# Patient Record
Sex: Male | Born: 1990 | Race: White | Hispanic: No | Marital: Single | Smoking: Current every day smoker
Health system: Southern US, Community
[De-identification: ages and names within clinical notes are randomized; demographics above are authoritative.]

## PROBLEM LIST (undated history)

## (undated) DIAGNOSIS — L309 Dermatitis, unspecified: Secondary | ICD-10-CM

## (undated) DIAGNOSIS — F909 Attention-deficit hyperactivity disorder, unspecified type: Secondary | ICD-10-CM

## (undated) DIAGNOSIS — B86 Scabies: Secondary | ICD-10-CM

## (undated) DIAGNOSIS — F329 Major depressive disorder, single episode, unspecified: Secondary | ICD-10-CM

## (undated) DIAGNOSIS — F32A Depression, unspecified: Secondary | ICD-10-CM

## (undated) DIAGNOSIS — F319 Bipolar disorder, unspecified: Secondary | ICD-10-CM

## (undated) HISTORY — PX: EYE SURGERY: SHX253

---
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
## 2011-02-03 ENCOUNTER — Inpatient Hospital Stay (INDEPENDENT_AMBULATORY_CARE_PROVIDER_SITE_OTHER)
Admission: RE | Admit: 2011-02-03 | Discharge: 2011-02-03 | Disposition: A | Discharge status: Home / Self Care | Payer: Medicaid Other | Source: Ambulatory Visit | Attending: Emergency Medicine | Admitting: Emergency Medicine

## 2011-02-03 ENCOUNTER — Ambulatory Visit (INDEPENDENT_AMBULATORY_CARE_PROVIDER_SITE_OTHER): Payer: Medicaid Other

## 2011-02-03 DIAGNOSIS — S022XXA Fracture of nasal bones, initial encounter for closed fracture: Secondary | ICD-10-CM

## 2011-02-03 DIAGNOSIS — S01502A Unspecified open wound of oral cavity, initial encounter: Secondary | ICD-10-CM

## 2011-03-09 ENCOUNTER — Emergency Department (HOSPITAL_COMMUNITY)
Admission: EM | Admit: 2011-03-09 | Discharge: 2011-03-09 | Disposition: A | Discharge status: Home / Self Care | Payer: Medicaid Other | Attending: Emergency Medicine | Admitting: Emergency Medicine

## 2011-03-09 DIAGNOSIS — F329 Major depressive disorder, single episode, unspecified: Secondary | ICD-10-CM | POA: Insufficient documentation

## 2011-03-09 DIAGNOSIS — F3289 Other specified depressive episodes: Secondary | ICD-10-CM | POA: Insufficient documentation

## 2011-03-09 LAB — DIFFERENTIAL
Basophils Relative: 1 % (ref 0–1)
Lymphocytes Relative: 24 % (ref 12–46)
Monocytes Absolute: 0.8 10*3/uL (ref 0.1–1.0)
Monocytes Relative: 9 % (ref 3–12)

## 2011-03-09 LAB — CBC
HCT: 44.5 % (ref 39.0–52.0)
MCH: 30.9 pg (ref 26.0–34.0)
MCHC: 34.4 g/dL (ref 30.0–36.0)
MCV: 89.9 fL (ref 78.0–100.0)
RDW: 12 % (ref 11.5–15.5)

## 2011-03-09 LAB — BASIC METABOLIC PANEL
BUN: 13 mg/dL (ref 6–23)
CO2: 25 mEq/L (ref 19–32)
Chloride: 101 mEq/L (ref 96–112)
Glucose, Bld: 95 mg/dL (ref 70–99)
Potassium: 3.7 mEq/L (ref 3.5–5.1)
Sodium: 135 mEq/L (ref 135–145)

## 2011-03-09 LAB — RAPID URINE DRUG SCREEN, HOSP PERFORMED: Cocaine: NOT DETECTED
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
## 2011-04-19 NOTE — Op Note (Signed)
Patrick James, Patrick James NO.:  [id number]   MEDICAL RECORD NO.:  [id number]          PATIENT TYPE:  AMB   LOCATION:  DSC                          FACILITY:  [facility]   PHYSICIAN:  Pasty Spillers. Maple Hudson, M.D. DATE OF BIRTH:  December 14, 1990   DATE OF PROCEDURE:  08/15/2008  DATE OF DISCHARGE:  08/15/2008                               OPERATIVE REPORT   PREOPERATIVE DIAGNOSIS:  V pattern exotropia.   POSTOPERATIVE DIAGNOSIS:  V pattern exotropia.   PROCEDURES:  1. Right lateral rectus muscle recession, 6.5 mm.  2. Right medial rectus muscle resection, 6.5 mm.  3. Inferior oblique muscle recession, both eyes.   SURGEON:  Pasty Spillers. Young, MD   ANESTHESIA:  General (laryngeal mask).   COMPLICATIONS:  None.   DESCRIPTION OF PROCEDURE:  After routine preoperative evaluation  including informed consent from the mother, the patient was taken to the  operating room where he was identified by me.  General anesthesia was  induced without difficulty after placement of appropriate monitors.  The  patient was prepped and draped in standard sterile fashion.  A lid  speculum was placed in the left eye.   Through an inferotemporal fornix incision through conjunctiva and Tenon  fascia, the left lateral rectus muscle was engaged on a series of muscle  hooks, and a traction suture of 4-0 silk was passed under the lateral  rectus muscle insertion.  This was used to draw the eye up and in.  Using 2 muscle hooks through the conjunctival incision for exposure, the  left inferior oblique muscle was identified and engaged on an oblique  hook.  It was cleared of its fascial attachments all the way to its  insertion, which was secured with a fine curved hemostat.  The muscle  was disinserted.  The cut end was secured with a double-arm 6-0 Vicryl  suture,with a double-locking bite at each border of the muscle, 1 mm  from the insertion.  The left inferior rectus muscle was engaged on a  series of  muscle hooks.  A mark was made on the sclera 3 mm posterior  and 3 mm temporal to the temporal border of the inferior rectus  insertion, and this was used as the exit point for the pole sutures of  the inferior oblique, which were passed in crossed swords fashion and  tied securely.  The traction suture was removed.  Conjunctiva was closed  with two 6-0 Vicryl sutures.  The speculum was transferred to the right  eye.   Through an inferonasal fornix incision through conjunctiva and Tenon  fascia, the right medial rectus muscle was engaged on a series of muscle  hooks and cleared of its fascial attachments to at least 15 mm posterior  to the insertion.  Note, that the conjunctival incision was made deep in  the conjunctival fornix, immediately adjacent to the plica semilunaris.  The muscle was spread between 2 self-retaining hooks.  A 2-mm bite was  taken of the center of the muscle belly at a measured distance of 6.5 mm  posterior to the insertion with a  double-arm 6-0 Vicryl suture and a  knot was tied securely at this location.  The needle at each end of the  double-arm suture was passed from the center of the muscle all the way  to periphery, parallel to and 6.5 mm posterior to the insertion.  A  double-locking bite was placed at each border of the muscle.  A  resection clamp was placed on muscle just anterior to these sutures.  The muscle was disinserted.  Each pole suture was passed posteriorly to  anteriorly through the corresponding end of the muscle stump, then  anteriorly to posteriorly near the center of the stump, then posteriorly  to anteriorly through the center of the muscle belly, just posterior to  the previously placed knot.  The muscle was drawn up to the level of the  original insertion, and the sutures were tied securely after making sure  that all slack had been removed.  The clamp was removed.  The portion of  the muscle anterior to the suture was carefully excised.   Conjunctiva  was closed with two 6-0 Vicryl sutures.  Through an inferotemporal  fornix incision through conjunctiva and Tenon fascia, the right inferior  oblique muscle was recessed just as described for the left inferior  oblique muscle.  The right lateral rectus muscle was again engaged on a  series of muscle hooks and cleared of its fascial attachments.  During  the dissection of the right lateral rectus muscle, it appeared that  inferior one fourth of muscle may have been inadvertently vertically  disinserted during the dissection in the inferior oblique.  Remaining  lateral rectus muscle insertion was secured with a double-arm 6-0 Vicryl  suture, double-locking bite at each border of muscle, 1 mm from the  insertion.  The muscle was disinserted and was reattached to the sclera  at a measured distance of 6.5 mm posterior to the original insertion,  using direct scleral passes in crossed swords fashion.  The suture ends  were tied securely after the position of the muscle had been checked and  found to be accurate.  The conjunctiva was closed with two 6-0 Vicryl  sutures after the traction sutures had been removed.  TobraDex ointment  was placed in each eye.  The patient was awakened without difficulty and  taken to the recovery room in stable condition.       Pasty Spillers. Maple Hudson, M.D.  Electronically Signed     WOY/MEDQ  D:  09/25/2008  T:  09/26/2008  Job:  [id number]
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
## 2011-04-22 NOTE — Op Note (Signed)
NAMEJOBIN, Patrick James NO.:  [id number]   MEDICAL RECORD NO.:  [id number]          PATIENT TYPE:  AMB   LOCATION:  DSC                          FACILITY:  [facility]   PHYSICIAN:  Leonia Corona, M.D.  DATE OF BIRTH:  16-Jul-1991   DATE OF PROCEDURE:  08/10/2005  DATE OF DISCHARGE:  08/09/2005                                 OPERATIVE REPORT   PREOPERATIVE DIAGNOSIS:  Infected ingrown right big toenail.   POSTOPERATIVE DIAGNOSIS:  Infected ingrown right big toenail.   OPERATION PERFORMED:  Partial excision of right big toenail.   SURGEON:  Leonia Corona, M.D.   ASSISTANT:  Nurse.   ANESTHESIA:  General laryngeal mask.   INDICATIONS FOR PROCEDURE:  This 20 year old male child was evaluated for a  severely abnormally ingrown toenail with recurrent infection on the right  side.  Hence the indication for the procedure.   DESCRIPTION OF PROCEDURE:  The patient was brought to the operating room and  placed supine on the operating table. General laryngeal mask anesthesia was  given.  The right foot was cleaned, prepped and draped in the usual manner  and a rectangular incision on the outer and inner edges of the right big  toenail was marked which enclosed the ingrown nail as well as the overgrown  soft tissue, extending proximally up to the root of the nail.  The entire  abnormal overgrown soft tissue was excised along with the rectangular piece  of edge of the nail which was lifted off from the nail bed as well as the  matrix was scraped and removed.  Prior to the procedure approximately 10 mL  of 0.25% Marcaine without epinephrine was infiltrated for toe block for  postoperative pain control.  The similar excision of a rectangular piece  from the inner edge was also done, since both the edges were abnormally  digging into the soft tissue, causing recurrent infection. Approximately 50%  of the nail was thus removed, 25% on each side of the nail.  The center  portion of the nail was preserved with the nail bed without any disturbance.  After curetting the matrix of the nail to prevent further growth, the wound  was thoroughly irrigated with dilute hydrogen peroxide and packed with  bacitracin ointment gauze.  A tight pressure dressing was applied to prevent  any active bleeding  which was covered with sterile gauze and Kerlix dressing and wrapped with  Ace bandage.  The patient tolerated the procedure well, which was smooth and  uneventful.  The patient was later extubated and transported to recovery  room in good and stable condition.      Leonia Corona, M.D.  Electronically Signed     SF/MEDQ  D:  08/10/2005  T:  08/11/2005  Job:  [id number]   cc:   Fonnie Mu, M.D.  Fax: [phone]
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
## 2011-04-22 NOTE — Op Note (Signed)
NAMEMARSHUN, DUVA NO.:  [id number]   MEDICAL RECORD NO.:  [id number]          PATIENT TYPE:  AMB   LOCATION:  DSC                          FACILITY:  [facility]   PHYSICIAN:  Leonia Corona, M.D.  DATE OF BIRTH:  09-28-1991   DATE OF PROCEDURE:  06/08/2006  DATE OF DISCHARGE:  06/08/2006                                 OPERATIVE REPORT   SURGEON:  Leonia Corona, M.D.   ASSISTANT:  Nurse.   PREOPERATIVE DIAGNOSIS:  Ingrown infected left big toenail.   POSTOPERATIVE DIAGNOSIS:  Ingrown infected left big toenail.   PROCEDURE PERFORMED:  Partial toenail excision of the left big toe.   ANESTHESIA:  General laryngeal mask anesthesia.   INDICATIONS FOR PROCEDURE:  This 20 year old male child was evaluated for a  painful left big toenail.  Clinical examination revealed ingrown toenail  with infection, which was initially treated with antibiotics, and  subsequently brought in for partial toenail excision.  Hence, the  indication.   PROCEDURE IN DETAIL:  The patient was brought into the operating room and  placed supine on the operating table.  General laryngeal mask anesthesia was  given.  The left foot was cleaned, prepped and draped in the usual manner.  A rectangular-shaped incision was marked on the left big toenail on the  inner edge, and closing part of the nail, lateral skin fold and proximal  skin fold, and soft tissue anteriorly.  The incision was made with a knife,  and then the rectangular piece of nail was lifted off the nail bed with the  help of a Freer.  Then, the nail along with the soft tissue and proximal  nail matrix was excised completely.  The nail bed was scraped to prevent  redevelopment of the nail, including the matrix, which was destroyed by  mechanical cleansing with curet.  The wound was thoroughly irrigated and no  active bleeders were noted.  After cleaning the rectangular piece of excised  tissue, the wound was inspected.  No  active bleeding was noted.  At this  point, we turned our attention to the outer edge of the nail, which also  appeared to be ingrowing into the soft tissue, causing injury.  A  rectangular piece was planned to be removed from this area also, for which  an incision was marked with a marking pen.  Then, the incision was made with  a knife.  The soft tissue along with the outer edge of the toenail,  including the proximal matrix, was excised completely, and the wound was  curettaged to prevent redevelopment of that part of the nail.  The wound was  thoroughly irrigated and packed with Vaseline gauze and a sterile gauze  dressing.  The patient tolerated the procedure very well, which was smooth  and __________.  The patient was later extubated and transported to the  recovery room in good, stable condition.      Leonia Corona, M.D.     SF/MEDQ  D:  07/04/2006  T:  07/04/2006  Job:  [id number]   cc:   Fonnie Mu,  M.D. 

## 2011-04-26 ENCOUNTER — Inpatient Hospital Stay (INDEPENDENT_AMBULATORY_CARE_PROVIDER_SITE_OTHER)
Admission: RE | Admit: 2011-04-26 | Discharge: 2011-04-26 | Disposition: A | Discharge status: Home / Self Care | Payer: Medicaid Other | Source: Ambulatory Visit | Attending: Emergency Medicine | Admitting: Emergency Medicine

## 2011-04-26 DIAGNOSIS — R0789 Other chest pain: Secondary | ICD-10-CM

## 2011-07-05 ENCOUNTER — Encounter (HOSPITAL_COMMUNITY): Payer: Self-pay | Admitting: Radiology

## 2011-07-05 ENCOUNTER — Inpatient Hospital Stay (INDEPENDENT_AMBULATORY_CARE_PROVIDER_SITE_OTHER)
Admission: RE | Admit: 2011-07-05 | Discharge: 2011-07-05 | Disposition: A | Discharge status: Home / Self Care | Payer: Medicaid Other | Source: Ambulatory Visit | Attending: Family Medicine | Admitting: Family Medicine

## 2011-07-05 ENCOUNTER — Emergency Department (HOSPITAL_COMMUNITY): Payer: Medicaid Other

## 2011-07-05 ENCOUNTER — Emergency Department (HOSPITAL_COMMUNITY)
Admission: EM | Admit: 2011-07-05 | Discharge: 2011-07-05 | Disposition: A | Discharge status: Home / Self Care | Payer: Medicaid Other | Attending: Emergency Medicine | Admitting: Emergency Medicine

## 2011-07-05 DIAGNOSIS — R51 Headache: Secondary | ICD-10-CM | POA: Insufficient documentation

## 2011-07-05 DIAGNOSIS — S0990XA Unspecified injury of head, initial encounter: Secondary | ICD-10-CM

## 2011-07-05 DIAGNOSIS — IMO0002 Reserved for concepts with insufficient information to code with codable children: Secondary | ICD-10-CM | POA: Insufficient documentation

## 2011-07-05 DIAGNOSIS — M542 Cervicalgia: Secondary | ICD-10-CM | POA: Insufficient documentation

## 2011-07-05 DIAGNOSIS — S0003XA Contusion of scalp, initial encounter: Secondary | ICD-10-CM | POA: Insufficient documentation

## 2011-07-05 DIAGNOSIS — Z733 Stress, not elsewhere classified: Secondary | ICD-10-CM

## 2011-07-05 DIAGNOSIS — R6884 Jaw pain: Secondary | ICD-10-CM | POA: Insufficient documentation

## 2011-07-18 ENCOUNTER — Emergency Department (HOSPITAL_COMMUNITY)
Admission: EM | Admit: 2011-07-18 | Discharge: 2011-07-18 | Disposition: A | Discharge status: Home / Self Care | Payer: Medicaid Other | Attending: Emergency Medicine | Admitting: Emergency Medicine

## 2011-07-18 DIAGNOSIS — F319 Bipolar disorder, unspecified: Secondary | ICD-10-CM | POA: Insufficient documentation

## 2011-07-18 DIAGNOSIS — Z79899 Other long term (current) drug therapy: Secondary | ICD-10-CM | POA: Insufficient documentation

## 2011-07-18 LAB — DIFFERENTIAL
Basophils Absolute: 0.1 10*3/uL (ref 0.0–0.1)
Basophils Relative: 1 % (ref 0–1)
Eosinophils Absolute: 0.4 10*3/uL (ref 0.0–0.7)
Eosinophils Relative: 6 % — ABNORMAL HIGH (ref 0–5)
Monocytes Absolute: 0.7 10*3/uL (ref 0.1–1.0)
Monocytes Relative: 11 % (ref 3–12)
Neutro Abs: 3.6 10*3/uL (ref 1.7–7.7)
Neutrophils Relative %: 56 % (ref 43–77)

## 2011-07-18 LAB — COMPREHENSIVE METABOLIC PANEL
Albumin: 4.4 g/dL (ref 3.5–5.2)
Alkaline Phosphatase: 67 U/L (ref 39–117)
CO2: 27 mEq/L (ref 19–32)
GFR calc Af Amer: >60 mL/min (ref >60)
GFR calc non Af Amer: >60 mL/min (ref >60)
Glucose, Bld: 100 mg/dL — ABNORMAL HIGH (ref 70–99)
Potassium: 3.8 mEq/L (ref 3.5–5.1)
Sodium: 139 mEq/L (ref 135–145)

## 2011-07-18 LAB — RAPID URINE DRUG SCREEN, HOSP PERFORMED
Cocaine: NOT DETECTED
Opiates: NOT DETECTED
Tetrahydrocannabinol: POSITIVE — AB

## 2011-07-18 LAB — URINALYSIS, ROUTINE W REFLEX MICROSCOPIC
Glucose, UA: NEGATIVE mg/dL
Hgb urine dipstick: NEGATIVE
Ketones, ur: NEGATIVE mg/dL
Leukocytes, UA: NEGATIVE
Protein, ur: NEGATIVE mg/dL
Urobilinogen, UA: 1 mg/dL (ref 0.0–1.0)
pH: 6 (ref 5.0–8.0)

## 2011-07-18 LAB — CBC
MCV: 88 fL (ref 78.0–100.0)
Platelets: 192 10*3/uL (ref 150–400)
WBC: 6.4 10*3/uL (ref 4.0–10.5)

## 2011-08-11 ENCOUNTER — Inpatient Hospital Stay (INDEPENDENT_AMBULATORY_CARE_PROVIDER_SITE_OTHER)
Admission: RE | Admit: 2011-08-11 | Discharge: 2011-08-11 | Disposition: A | Discharge status: Home / Self Care | Payer: Medicaid Other | Source: Ambulatory Visit | Attending: Family Medicine | Admitting: Family Medicine

## 2011-08-11 DIAGNOSIS — L738 Other specified follicular disorders: Secondary | ICD-10-CM

## 2011-09-09 LAB — COMPREHENSIVE METABOLIC PANEL
ALT: 22
AST: 20
Albumin: 3.9
BUN: 10
Calcium: 9.6
Creatinine, Ser: 0.75
Sodium: 136

## 2011-09-09 LAB — URINALYSIS, DIPSTICK ONLY
Nitrite: NEGATIVE
Specific Gravity, Urine: 1.029
Urobilinogen, UA: 0.2

## 2011-09-09 LAB — LIPID PANEL
HDL: 25 — ABNORMAL LOW
Triglycerides: 102
VLDL: 20

## 2011-09-12 LAB — CBC
MCV: 86.3
Platelets: 198
WBC: 8.6

## 2011-09-12 LAB — BASIC METABOLIC PANEL
BUN: 11
Chloride: 103
Creatinine, Ser: 0.71

## 2011-09-12 LAB — DIFFERENTIAL
Basophils Absolute: 0.1
Basophils Relative: 1
Eosinophils Absolute: 0.7
Lymphs Abs: 3.3
Neutrophils Relative %: 45

## 2011-09-12 LAB — HEPATIC FUNCTION PANEL
Alkaline Phosphatase: 138
Bilirubin, Direct: 0.2
Indirect Bilirubin: 0.4
Total Bilirubin: 0.6
Total Protein: 7.1

## 2011-09-12 LAB — DRUGS OF ABUSE SCREEN W/O ALC, ROUTINE URINE
Creatinine,U: 203.9
Marijuana Metabolite: NEGATIVE
Opiate Screen, Urine: NEGATIVE
Propoxyphene: NEGATIVE

## 2011-09-12 LAB — URINALYSIS, ROUTINE W REFLEX MICROSCOPIC
Bilirubin Urine: NEGATIVE
Nitrite: NEGATIVE
Specific Gravity, Urine: 1.029
Urobilinogen, UA: 1

## 2011-09-12 LAB — GAMMA GT: GGT: 24

## 2011-09-12 LAB — T4, FREE: Free T4: 1.18

## 2011-09-12 LAB — GC/CHLAMYDIA PROBE AMP, URINE
Chlamydia, Swab/Urine, PCR: NEGATIVE
GC Probe Amp, Urine: NEGATIVE

## 2011-09-28 ENCOUNTER — Emergency Department (HOSPITAL_COMMUNITY)
Admission: EM | Admit: 2011-09-28 | Discharge: 2011-09-28 | Disposition: A | Discharge status: Home / Self Care | Payer: Medicaid Other | Attending: Emergency Medicine | Admitting: Emergency Medicine

## 2011-09-28 DIAGNOSIS — F3130 Bipolar disorder, current episode depressed, mild or moderate severity, unspecified: Secondary | ICD-10-CM | POA: Insufficient documentation

## 2011-09-28 DIAGNOSIS — L259 Unspecified contact dermatitis, unspecified cause: Secondary | ICD-10-CM | POA: Insufficient documentation

## 2011-09-28 DIAGNOSIS — R21 Rash and other nonspecific skin eruption: Secondary | ICD-10-CM | POA: Insufficient documentation

## 2011-09-28 DIAGNOSIS — F988 Other specified behavioral and emotional disorders with onset usually occurring in childhood and adolescence: Secondary | ICD-10-CM | POA: Insufficient documentation

## 2011-10-18 ENCOUNTER — Encounter (HOSPITAL_COMMUNITY): Payer: Self-pay | Admitting: Emergency Medicine

## 2011-10-18 ENCOUNTER — Emergency Department (HOSPITAL_COMMUNITY)
Admission: EM | Admit: 2011-10-18 | Discharge: 2011-10-18 | Disposition: A | Discharge status: Home / Self Care | Payer: Medicaid Other | Attending: Emergency Medicine | Admitting: Emergency Medicine

## 2011-10-18 DIAGNOSIS — L408 Other psoriasis: Secondary | ICD-10-CM | POA: Insufficient documentation

## 2011-10-18 DIAGNOSIS — R21 Rash and other nonspecific skin eruption: Secondary | ICD-10-CM | POA: Insufficient documentation

## 2011-10-18 DIAGNOSIS — L259 Unspecified contact dermatitis, unspecified cause: Secondary | ICD-10-CM | POA: Insufficient documentation

## 2011-10-18 DIAGNOSIS — L309 Dermatitis, unspecified: Secondary | ICD-10-CM

## 2011-10-18 DIAGNOSIS — L409 Psoriasis, unspecified: Secondary | ICD-10-CM

## 2011-10-18 HISTORY — DX: Dermatitis, unspecified: L30.9

## 2011-10-18 MED ORDER — PREDNISONE 10 MG PO TABS: 20.0000 mg | ORAL_TABLET | Freq: Every day | ORAL | Status: AC

## 2011-10-18 MED ORDER — HYDROXYZINE HCL 25 MG PO TABS: 25.0000 mg | ORAL_TABLET | Freq: Four times a day (QID) | ORAL | Status: AC

## 2011-10-18 MED ORDER — PREDNISONE 20 MG PO TABS
40.0000 mg | ORAL_TABLET | Freq: Once | ORAL | Status: AC
  Administered 2011-10-18: 40 mg via ORAL

## 2011-10-18 MED ORDER — PREDNISONE 20 MG PO TABS
ORAL_TABLET | ORAL | Status: AC
  Filled 2011-10-18: qty 2

## 2011-10-18 NOTE — ED Notes (Signed)
PT. REPORTS GENERALIZED ITCHY RASHES X3 MONTHS . RESPIRATIONS UNLABORED.

## 2011-10-18 NOTE — ED Provider Notes (Addendum)
History     CSN: [id number] Arrival date & time: 10/18/2011  5:58 AM   First MD Initiated Contact with Patient 10/18/11 [phone]    Chief Complaint  Patient presents with  . Rash    (Consider location/radiation/quality/duration/timing/severity/associated sxs/prior treatment) HPI Patient presents to emergency room with chief complaint of rash for the last 3 months.  Patient has past history of eczema.  The rash is primarily on his stomach, trunk, upper extremities, bilateral hands.  Rash is not associated with fever, nausea, vomiting.  Patient has no exposure to chemicals, pet dander, or new medications.  Patient does take medication for ADHD    Past Medical History  Diagnosis Date  . Eczema     History reviewed. No pertinent past surgical history.  History reviewed. No pertinent family history.  History  Substance Use Topics  . Smoking status: Never Smoker   . Smokeless tobacco: Not on file  . Alcohol Use: No      Review of Systems  All other systems reviewed and are negative.    Allergies  Review of patient's allergies indicates no known allergies.  Home Medications   Current Outpatient Rx  Name Route Sig Dispense Refill  . LISDEXAMFETAMINE DIMESYLATE 40 MG PO CAPS Oral Take 40 mg by mouth every morning.        BP 146/65  Pulse 79  Temp(Src) 97.7 F (36.5 C) (Oral)  Resp 14  SpO2 100%  Physical Exam  Nursing note and vitals reviewed. Constitutional: He is oriented to person, place, and time. He appears well-developed and well-nourished. No distress.  HENT:  Head: Normocephalic and atraumatic.  Eyes: Pupils are equal, round, and reactive to light.  Neck: Normal range of motion.  Cardiovascular: Normal rate and intact distal pulses.   Pulmonary/Chest: No respiratory distress.  Abdominal: Normal appearance. He exhibits no distension.  Musculoskeletal: Normal range of motion.  Neurological: He is alert and oriented to person, place, and time. No cranial  nerve deficit.  Skin: Skin is warm and dry. Rash noted. No purpura noted. Rash is not vesicular.     Psychiatric: He has a normal mood and affect. His behavior is normal.    ED Course  Procedures (including critical care time)  Labs Reviewed - No data to display No results found.   1. Eczema   2. Psoriasis       MDM   Plan is for a round of steroids in medication for itching.       Nelia Shi, MD 10/18/11 1191  Nelia Shi, MD 10/18/11 [phone]

## 2011-10-22 ENCOUNTER — Encounter (HOSPITAL_COMMUNITY): Payer: Self-pay | Admitting: Emergency Medicine

## 2011-10-22 ENCOUNTER — Emergency Department (HOSPITAL_COMMUNITY)
Admission: EM | Admit: 2011-10-22 | Discharge: 2011-10-22 | Disposition: A | Discharge status: Home / Self Care | Payer: Medicaid Other | Attending: Emergency Medicine | Admitting: Emergency Medicine

## 2011-10-22 DIAGNOSIS — L2989 Other pruritus: Secondary | ICD-10-CM | POA: Insufficient documentation

## 2011-10-22 DIAGNOSIS — L298 Other pruritus: Secondary | ICD-10-CM | POA: Insufficient documentation

## 2011-10-22 DIAGNOSIS — L309 Dermatitis, unspecified: Secondary | ICD-10-CM

## 2011-10-22 DIAGNOSIS — L259 Unspecified contact dermatitis, unspecified cause: Secondary | ICD-10-CM | POA: Insufficient documentation

## 2011-10-22 MED ORDER — ATOPICLAIR EX CREA: TOPICAL_CREAM | CUTANEOUS | Status: DC

## 2011-10-22 NOTE — ED Notes (Signed)
Pt presents to department for evaluation of generalized rash all over body. Pt states severe itching and irritation. History of eczema. States he doesn't have any cream to use at home. Pt resting quietly at the time. No signs of distress. He is alert and oriented x4.

## 2011-10-22 NOTE — ED Notes (Signed)
C/o eczema x 4 months that is getting worse.

## 2011-10-22 NOTE — ED Provider Notes (Signed)
Evaluation and management procedures were performed by the mid-level provider (PA/NP/CNM) under my supervision/collaboration. I was present and available during the ED course. Patrick Karg Y.   Gavin Pound. Oletta Lamas, MD 10/22/11 [phone]

## 2011-10-22 NOTE — ED Notes (Signed)
Pt given sandwich and drink. Had no further questions regarding discharge.

## 2011-10-22 NOTE — ED Provider Notes (Signed)
History     CSN: [id number] Arrival date & time: 10/22/2011 12:44 AM   First MD Initiated Contact with Patient 10/22/11 0138      Chief Complaint  Patient presents with  . Eczema     Patient is a 20 y.o. male presenting with rash.  Rash  This is a chronic problem. The current episode started more than 1 week ago. The problem has not changed since onset.The problem is associated with nothing. There has been no fever. The rash is present on the trunk, right arm, left arm, right lower leg, left upper leg, right upper leg and left lower leg. The patient is experiencing no pain. The pain has been constant since onset. Associated symptoms include itching.   patient reports persistent rash to bilateral upper extremities and  Torso. Of eczema since childhood. Was seen here in the emergency department 10/19/2011 for same was placed on a week of prednisone and hydroxyzine for itching. Returns tonight stating meds are not help.   Past Medical History  Diagnosis Date  . Eczema     History reviewed. No pertinent past surgical history.  No family history on file.  History  Substance Use Topics  . Smoking status: Never Smoker   . Smokeless tobacco: Not on file  . Alcohol Use: No      Review of Systems  Constitutional: Negative.   HENT: Negative.   Eyes: Negative.   Respiratory: Negative.   Cardiovascular: Negative.   Gastrointestinal: Negative.   Genitourinary: Negative.   Musculoskeletal: Negative.   Skin: Positive for itching and rash.  Neurological: Negative.   Hematological: Negative.   Psychiatric/Behavioral: Negative.     Allergies  Review of patient's allergies indicates no known allergies.  Home Medications   Current Outpatient Rx  Name Route Sig Dispense Refill  . HYDROXYZINE HCL 25 MG PO TABS Oral Take 1 tablet (25 mg total) by mouth every 6 (six) hours. 12 tablet 0  . LISDEXAMFETAMINE DIMESYLATE 40 MG PO CAPS Oral Take 40 mg by mouth every morning.      [name]  PREDNISONE 10 MG PO TABS Oral Take 2 tablets (20 mg total) by mouth daily. 15 tablet 0    BP 136/77  Pulse 77  Temp(Src) 96.8 F (36 C) (Oral)  SpO2 98%  Physical Exam  Constitutional: He is oriented to person, place, and time. He appears well-developed and well-nourished.  HENT:  Head: Normocephalic and atraumatic.  Eyes: Pupils are equal, round, and reactive to light.  Neck: Neck supple.  Cardiovascular: Normal rate and regular rhythm.   Pulmonary/Chest: Effort normal and breath sounds normal.  Abdominal: Soft. Bowel sounds are normal.  Neurological: He is alert and oriented to person, place, and time.  Skin: Skin is warm and dry.       Fine, erythematous, crusty rash noted to bilateral upper extremities bilateral lower, extremities,  Torso and back.    Psychiatric: He has a normal mood and affect.    ED Course  Procedures patient reports persistent eczema flare x3-4 months. Patient states rash is consistent with rash that he has intermittently since childhood. Seen 10/19/2011 here in the ED and started on prednisone and hydroxyzine but states they are not helping.   Labs Reviewed - No data to display No results found.   No diagnosis found.    MDM  Will add atopic layer cream to current medication regimen for rash and provide dermatology referral if rash is not improving.  Leanne Chang, NP 10/22/11 0225

## 2011-11-04 ENCOUNTER — Encounter (HOSPITAL_COMMUNITY): Payer: Self-pay | Admitting: *Deleted

## 2011-11-04 ENCOUNTER — Emergency Department (HOSPITAL_COMMUNITY)
Admission: EM | Admit: 2011-11-04 | Discharge: 2011-11-04 | Disposition: A | Discharge status: Home / Self Care | Payer: Medicaid Other | Source: Home / Self Care | Attending: Emergency Medicine | Admitting: Emergency Medicine

## 2011-11-04 ENCOUNTER — Emergency Department (HOSPITAL_COMMUNITY)
Admission: EM | Admit: 2011-11-04 | Discharge: 2011-11-05 | Disposition: A | Discharge status: Home / Self Care | Payer: Medicaid Other | Attending: Emergency Medicine | Admitting: Emergency Medicine

## 2011-11-04 DIAGNOSIS — S4980XA Other specified injuries of shoulder and upper arm, unspecified arm, initial encounter: Secondary | ICD-10-CM | POA: Insufficient documentation

## 2011-11-04 DIAGNOSIS — Y9239 Other specified sports and athletic area as the place of occurrence of the external cause: Secondary | ICD-10-CM | POA: Insufficient documentation

## 2011-11-04 DIAGNOSIS — F909 Attention-deficit hyperactivity disorder, unspecified type: Secondary | ICD-10-CM | POA: Insufficient documentation

## 2011-11-04 DIAGNOSIS — IMO0002 Reserved for concepts with insufficient information to code with codable children: Secondary | ICD-10-CM

## 2011-11-04 DIAGNOSIS — B86 Scabies: Secondary | ICD-10-CM | POA: Insufficient documentation

## 2011-11-04 DIAGNOSIS — W219XXA Striking against or struck by unspecified sports equipment, initial encounter: Secondary | ICD-10-CM | POA: Insufficient documentation

## 2011-11-04 DIAGNOSIS — Z79899 Other long term (current) drug therapy: Secondary | ICD-10-CM | POA: Insufficient documentation

## 2011-11-04 DIAGNOSIS — F3289 Other specified depressive episodes: Secondary | ICD-10-CM | POA: Insufficient documentation

## 2011-11-04 DIAGNOSIS — F329 Major depressive disorder, single episode, unspecified: Secondary | ICD-10-CM | POA: Insufficient documentation

## 2011-11-04 DIAGNOSIS — M25519 Pain in unspecified shoulder: Secondary | ICD-10-CM | POA: Insufficient documentation

## 2011-11-04 DIAGNOSIS — S40019A Contusion of unspecified shoulder, initial encounter: Secondary | ICD-10-CM | POA: Insufficient documentation

## 2011-11-04 DIAGNOSIS — S46909A Unspecified injury of unspecified muscle, fascia and tendon at shoulder and upper arm level, unspecified arm, initial encounter: Secondary | ICD-10-CM | POA: Insufficient documentation

## 2011-11-04 DIAGNOSIS — L989 Disorder of the skin and subcutaneous tissue, unspecified: Secondary | ICD-10-CM | POA: Insufficient documentation

## 2011-11-04 DIAGNOSIS — Y9367 Activity, basketball: Secondary | ICD-10-CM | POA: Insufficient documentation

## 2011-11-04 HISTORY — DX: Attention-deficit hyperactivity disorder, unspecified type: F90.9

## 2011-11-04 HISTORY — DX: Major depressive disorder, single episode, unspecified: F32.9

## 2011-11-04 HISTORY — DX: Depression, unspecified: F32.A

## 2011-11-04 MED ORDER — PERMETHRIN 5 % EX CREA
TOPICAL_CREAM | Freq: Once | CUTANEOUS | Status: AC
  Administered 2011-11-04: 04:00:00 via TOPICAL
  Filled 2011-11-04: qty 60

## 2011-11-04 MED ORDER — ACETAMINOPHEN 325 MG PO TABS
650.0000 mg | ORAL_TABLET | Freq: Once | ORAL | Status: AC
  Administered 2011-11-04: 650 mg via ORAL
  Filled 2011-11-04: qty 2

## 2011-11-04 MED ORDER — DIPHENHYDRAMINE HCL 25 MG PO CAPS
25.0000 mg | ORAL_CAPSULE | Freq: Once | ORAL | Status: AC
  Administered 2011-11-04: 25 mg via ORAL
  Filled 2011-11-04: qty 1

## 2011-11-04 MED ORDER — PERMETHRIN 5 % EX CREA: TOPICAL_CREAM | CUTANEOUS | Status: DC

## 2011-11-04 NOTE — ED Notes (Signed)
Pt presents with a generalized rash that covers his arms, legs, trunk, and neck.  Pt states that said rash itches quite badly.  Pt states that the rash started approximately 1 month ago and has progressively worsened.

## 2011-11-04 NOTE — ED Notes (Signed)
Per EMS:  Pt began having an itchy rash about 2 months ago for which he has received prednisone with no relief and his symptoms of itching have worsened.

## 2011-11-04 NOTE — ED Provider Notes (Signed)
Medical screening examination/treatment/procedure(s) were performed by non-physician practitioner and as supervising physician I was immediately available for consultation/collaboration.  Olivia Mackie, MD 11/04/11 [phone]

## 2011-11-04 NOTE — ED Provider Notes (Signed)
History     CSN: [id number] Arrival date & time: 11/04/2011  1:54 AM   First MD Initiated Contact with Patient 11/04/11 0301      Chief Complaint  Patient presents with  . Urticaria    (Consider location/radiation/quality/duration/timing/severity/associated sxs/prior treatment) Patient is a 20 y.o. male presenting with urticaria. The history is provided by the patient.  Urticaria This is a chronic problem. The current episode started more than 1 month ago. The problem occurs constantly. The problem has been gradually worsening. Associated symptoms include a rash. Pertinent negatives include no fever.    Past Medical History  Diagnosis Date  . Eczema   . ADHD (attention deficit hyperactivity disorder)   . Depression     History reviewed. No pertinent past surgical history.  History reviewed. No pertinent family history.  History  Substance Use Topics  . Smoking status: Never Smoker   . Smokeless tobacco: Not on file  . Alcohol Use: No      Review of Systems  Constitutional: Negative.  Negative for fever.  HENT: Negative.   Eyes: Negative.   Respiratory: Negative.   Cardiovascular: Negative.   Gastrointestinal: Negative.   Genitourinary: Negative.   Musculoskeletal: Negative.   Skin: Positive for rash.  Neurological: Negative.   Hematological: Negative.   Psychiatric/Behavioral: Negative.     Allergies  Review of patient's allergies indicates no known allergies.  Home Medications   Current Outpatient Rx  Name Route Sig Dispense Refill  . ATOPICLAIR EX CREA  Apply to affected areas TID 1 Tube 0  . LISDEXAMFETAMINE DIMESYLATE 40 MG PO CAPS Oral Take 40 mg by mouth every morning.      [name] PERMETHRIN 5 % EX CREA  Apply to affected area once 60 g 0    BP 156/69  Pulse 80  Temp(Src) 98 F (36.7 C) (Oral)  Resp 19  SpO2 100%  Physical Exam  Constitutional: He is oriented to person, place, and time. He appears well-developed and well-nourished.  HENT:    Head: Normocephalic.  Eyes: Pupils are equal, round, and reactive to light.  Neck: Normal range of motion. Neck supple.  Cardiovascular: Normal rate.   Pulmonary/Chest: Effort normal.  Abdominal: Soft.  Musculoskeletal: Normal range of motion.  Neurological: He is oriented to person, place, and time.  Skin: Skin is warm. Lesion and rash noted. Rash is not urticarial. There is erythema.    ED Course  Procedures (including critical care time)  Labs Reviewed - No data to display No results found.   1. Itch, scabies       MDM  Diffuse pruritic rash started between fingers and toes  Patient is homeless.  Has been treated with steroids for potenial eczema without relief         Arman Filter, NP 11/04/11 0518  Arman Filter, NP 11/04/11 [phone]

## 2011-11-04 NOTE — ED Notes (Signed)
He was playing footballl approx 6 hours ago and injured his lt  Shoulder.  C/o pain-

## 2011-11-05 ENCOUNTER — Emergency Department (HOSPITAL_COMMUNITY): Payer: Medicaid Other

## 2011-11-05 ENCOUNTER — Emergency Department (HOSPITAL_COMMUNITY)
Admission: EM | Admit: 2011-11-05 | Discharge: 2011-11-06 | Discharge status: Against Medical Advice | Payer: Medicaid Other | Attending: Emergency Medicine | Admitting: Emergency Medicine

## 2011-11-05 DIAGNOSIS — N23 Unspecified renal colic: Secondary | ICD-10-CM | POA: Insufficient documentation

## 2011-11-05 MED ORDER — IBUPROFEN 800 MG PO TABS
800.0000 mg | ORAL_TABLET | Freq: Once | ORAL | Status: AC
  Administered 2011-11-05: 800 mg via ORAL
  Filled 2011-11-05: qty 1

## 2011-11-05 NOTE — ED Provider Notes (Signed)
History     CSN: [id number] Arrival date & time: 11/04/2011 11:50 PM   First MD Initiated Contact with Patient 11/05/11 0245      Chief Complaint  Patient presents with  . Shoulder Injury    (Consider location/radiation/quality/duration/timing/severity/associated sxs/prior treatment) HPI Comments: And reports playing basketball today and was hit in the left shoulder now having pain with range of motion.  No deformity noted, saw this patient.  Last night, treated him for his scabies.  He stays at [employer].  Holding the arm stills seems to help slightly.   Patient is a 20 y.o. male presenting with shoulder injury. The history is provided by the patient.  Shoulder Injury This is a new problem. The current episode started today. The problem occurs constantly. The problem has been unchanged. Pertinent negatives include no joint swelling or myalgias. The symptoms are aggravated by exertion. He has tried nothing for the symptoms. The treatment provided no relief.    Past Medical History  Diagnosis Date  . Eczema   . ADHD (attention deficit hyperactivity disorder)   . Depression     History reviewed. No pertinent past surgical history.  History reviewed. No pertinent family history.  History  Substance Use Topics  . Smoking status: Never Smoker   . Smokeless tobacco: Not on file  . Alcohol Use: No      Review of Systems  Constitutional: Negative.   HENT: Negative.   Eyes: Negative.   Cardiovascular: Negative.   Gastrointestinal: Negative.   Genitourinary: Negative.   Musculoskeletal: Negative for myalgias, back pain and joint swelling.  Neurological: Negative.   Hematological: Negative.   Psychiatric/Behavioral: Negative.     Allergies  Review of patient's allergies indicates no known allergies.  Home Medications   Current Outpatient Rx  Name Route Sig Dispense Refill  . LISDEXAMFETAMINE DIMESYLATE 40 MG PO CAPS Oral Take 40 mg by mouth every morning.     [name]  PAROXETINE HCL 20 MG PO TABS Oral Take 20 mg by mouth every morning.        BP 135/74  Pulse 90  Temp(Src) 97.9 F (36.6 C) (Oral)  Resp 16  SpO2 98%  Physical Exam  Constitutional: He is oriented to person, place, and time. He appears well-developed and well-nourished.  HENT:  Head: Normocephalic.  Eyes: Pupils are equal, round, and reactive to light.  Neck: Neck supple.  Cardiovascular: Normal rate.   Pulmonary/Chest: Effort normal.  Musculoskeletal:       Left shoulder: He exhibits tenderness and pain. He exhibits no swelling, no deformity and normal pulse.  Neurological: He is oriented to person, place, and time.  Skin: Skin is warm and dry.    ED Course  Procedures (including critical care time)  Labs Reviewed - No data to display Dg Shoulder Left  11/05/2011  *RADIOLOGY REPORT*  Clinical Data: Left shoulder pain from football injury.  Limited range of motion and left shoulder swelling.  LEFT SHOULDER - 2+ VIEW  Comparison: None.  Findings: There is no evidence of fracture or dislocation.  The left humeral head is seated within the glenoid fossa.  The acromioclavicular joint is unremarkable in appearance.  No significant soft tissue abnormalities are seen.  The visualized portions of the left lung are clear.  IMPRESSION: No evidence of fracture or dislocation.  Original Report Authenticated By: Tonia Ghent, M.D.     1. Contusion, shoulder /upper arm       MDM  Is most likely a contusion to  2 history and physical exam, x-ray was ordered at triage and reviewed, which indicates no fracture or dislocation.  We'll treat this patient with ibuprofen        Arman Filter, NP 11/05/11 0541  Arman Filter, NP 11/05/11 [phone]

## 2011-11-05 NOTE — ED Provider Notes (Signed)
Medical screening examination/treatment/procedure(s) were performed by non-physician practitioner and as supervising physician I was immediately available for consultation/collaboration.  Doug Sou, MD 11/05/11 [phone]

## 2011-11-05 NOTE — ED Notes (Signed)
Pt resting quietly with eyes closed, no s/s of any pain or distress observed, pt is awaiting disposition and will continue to monitor pt.

## 2011-11-05 NOTE — ED Notes (Signed)
Pt reports hurting left shoulder playing football, pt resting with eyes closed upon assessment. Pt was easily arousable with reports of pain 7/10, MAE x4, INAD at this time.

## 2011-11-06 ENCOUNTER — Encounter (HOSPITAL_COMMUNITY): Payer: Self-pay | Admitting: Nurse Practitioner

## 2011-11-06 ENCOUNTER — Emergency Department (HOSPITAL_COMMUNITY): Payer: Medicaid Other

## 2011-11-06 ENCOUNTER — Emergency Department (HOSPITAL_COMMUNITY)
Admission: EM | Admit: 2011-11-06 | Discharge: 2011-11-06 | Disposition: A | Discharge status: Home / Self Care | Payer: Medicaid Other | Attending: Emergency Medicine | Admitting: Emergency Medicine

## 2011-11-06 DIAGNOSIS — F909 Attention-deficit hyperactivity disorder, unspecified type: Secondary | ICD-10-CM | POA: Insufficient documentation

## 2011-11-06 DIAGNOSIS — L298 Other pruritus: Secondary | ICD-10-CM | POA: Insufficient documentation

## 2011-11-06 DIAGNOSIS — L2989 Other pruritus: Secondary | ICD-10-CM | POA: Insufficient documentation

## 2011-11-06 DIAGNOSIS — B86 Scabies: Secondary | ICD-10-CM

## 2011-11-06 DIAGNOSIS — Y9383 Activity, rough housing and horseplay: Secondary | ICD-10-CM | POA: Insufficient documentation

## 2011-11-06 DIAGNOSIS — F329 Major depressive disorder, single episode, unspecified: Secondary | ICD-10-CM | POA: Insufficient documentation

## 2011-11-06 DIAGNOSIS — Z79899 Other long term (current) drug therapy: Secondary | ICD-10-CM | POA: Insufficient documentation

## 2011-11-06 DIAGNOSIS — R079 Chest pain, unspecified: Secondary | ICD-10-CM | POA: Insufficient documentation

## 2011-11-06 DIAGNOSIS — S299XXA Unspecified injury of thorax, initial encounter: Secondary | ICD-10-CM

## 2011-11-06 DIAGNOSIS — F3289 Other specified depressive episodes: Secondary | ICD-10-CM | POA: Insufficient documentation

## 2011-11-06 DIAGNOSIS — X500XXA Overexertion from strenuous movement or load, initial encounter: Secondary | ICD-10-CM | POA: Insufficient documentation

## 2011-11-06 DIAGNOSIS — IMO0001 Reserved for inherently not codable concepts without codable children: Secondary | ICD-10-CM | POA: Insufficient documentation

## 2011-11-06 DIAGNOSIS — R21 Rash and other nonspecific skin eruption: Secondary | ICD-10-CM | POA: Insufficient documentation

## 2011-11-06 DIAGNOSIS — S298XXA Other specified injuries of thorax, initial encounter: Secondary | ICD-10-CM | POA: Insufficient documentation

## 2011-11-06 MED ORDER — PERMETHRIN 0.25 % LIQD
Status: AC
  Administered 2011-11-06: 14:00:00 via TOPICAL
  Filled 2011-11-06: qty 147.86

## 2011-11-06 MED ORDER — OXYCODONE-ACETAMINOPHEN 5-325 MG PO TABS
1.0000 | ORAL_TABLET | Freq: Once | ORAL | Status: AC
  Administered 2011-11-06: 1 via ORAL
  Filled 2011-11-06: qty 1

## 2011-11-06 MED ORDER — PERMETHRIN 5 % EX CREA: TOPICAL_CREAM | CUTANEOUS | Status: AC

## 2011-11-06 MED ORDER — METHYLPREDNISOLONE SODIUM SUCC 125 MG IJ SOLR
125.0000 mg | Freq: Once | INTRAMUSCULAR | Status: AC
  Administered 2011-11-06: 125 mg via INTRAMUSCULAR
  Filled 2011-11-06: qty 2

## 2011-11-06 MED ORDER — PREDNISONE 20 MG PO TABS: 40.0000 mg | ORAL_TABLET | Freq: Every day | ORAL | Status: AC

## 2011-11-06 NOTE — ED Notes (Signed)
Patient transported to X-ray 

## 2011-11-06 NOTE — ED Provider Notes (Signed)
History     CSN: [id number] Arrival date & time: 11/06/2011 11:24 AM   First MD Initiated Contact with Patient 11/06/11 1159      Chief Complaint  Patient presents with  . Generalized Body Aches    (Consider location/radiation/quality/duration/timing/severity/associated sxs/prior treatment) HPI Comments: States that he has tried multiple rounds of treatment for this rash. It initially started on his hands and has now moved to bilateral Arms, trunk, axilla and back. He is scratching constantly due to the severe itching. States he's been diagnosed with eczema, psoriasis, scabies. In  Patient is a 20 y.o. male presenting with rash. The history is provided by the patient.  Rash  This is a chronic problem. Episode onset: 2 months ago. The problem has been gradually worsening. The problem is associated with an unknown factor. There has been no fever. The rash is present on the torso, back, groin, trunk, right arm, right hand, right fingers, left fingers, left hand and left arm. The pain is at a severity of 2/10. The pain is mild. The pain has been constant since onset. Associated symptoms include itching and pain. He has tried OTC analgesics, antihistamines, anti-itch cream and steriods (Has been using permethrin cream this last week) for the symptoms. The treatment provided no relief.    Past Medical History  Diagnosis Date  . Eczema   . ADHD (attention deficit hyperactivity disorder)   . Depression     History reviewed. No pertinent past surgical history.  History reviewed. No pertinent family history.  History  Substance Use Topics  . Smoking status: Never Smoker   . Smokeless tobacco: Not on file  . Alcohol Use: No      Review of Systems  Constitutional: Negative for fever.  Respiratory: Negative for cough and shortness of breath.   Cardiovascular: Positive for chest pain.       Was wrestling with friends last night and felt a pop in his chest and has been hurting ever  since  Skin: Positive for itching and rash.  All other systems reviewed and are negative.    Allergies  Review of patient's allergies indicates no known allergies.  Home Medications   Current Outpatient Rx  Name Route Sig Dispense Refill  . LISDEXAMFETAMINE DIMESYLATE 40 MG PO CAPS Oral Take 40 mg by mouth every morning.     [name] PAROXETINE HCL 20 MG PO TABS Oral Take 20 mg by mouth every morning.        BP 145/74  Pulse 73  Temp(Src) 97.9 F (36.6 C) (Oral)  Resp 20  SpO2 98%  Physical Exam  Nursing note and vitals reviewed. Constitutional: He is oriented to person, place, and time. He appears well-developed and well-nourished. No distress.  HENT:  Head: Normocephalic and atraumatic.  Mouth/Throat: Oropharynx is clear and moist.  Eyes: Conjunctivae and EOM are normal. Pupils are equal, round, and reactive to light.  Neck: Normal range of motion. Neck supple.  Cardiovascular: Normal rate, regular rhythm and intact distal pulses.   No murmur heard. Pulmonary/Chest: Effort normal and breath sounds normal. No respiratory distress. He has no wheezes. He has no rales. He exhibits tenderness. He exhibits no mass, no deformity and no swelling.    Abdominal: Soft. He exhibits no distension. There is no tenderness. There is no rebound and no guarding.  Musculoskeletal: Normal range of motion. He exhibits no edema and no tenderness.  Neurological: He is alert and oriented to person, place, and time.  Skin: Skin is warm  and dry. Rash noted. Rash is maculopapular. No erythema.       Severe maculopapular rash with excoriation, a stabbing and affecting bilateral hands, arms, forearms, legs, shoulders, back, chest. No purulent drainage or erythema suggestive of bacterial infection.  Psychiatric: He has a normal mood and affect. His behavior is normal.    ED Course  Procedures (including critical care time)  Results for orders placed during the hospital encounter of 07/18/11    DIFFERENTIAL      Component Value Range   Neutrophils Relative 56  43 - 77 (%)   Neutro Abs 3.6  1.7 - 7.7 (K/uL)   Lymphocytes Relative 26  12 - 46 (%)   Lymphs Abs 1.7  0.7 - 4.0 (K/uL)   Monocytes Relative 11  3 - 12 (%)   Monocytes Absolute 0.7  0.1 - 1.0 (K/uL)   Eosinophils Relative 6 (*) 0 - 5 (%)   Eosinophils Absolute 0.4  0.0 - 0.7 (K/uL)   Basophils Relative 1  0 - 1 (%)   Basophils Absolute 0.1  0.0 - 0.1 (K/uL)  CBC      Component Value Range   WBC 6.4  4.0 - 10.5 (K/uL)   RBC 5.09  4.22 - 5.81 (MIL/uL)   Hemoglobin 15.5  13.0 - 17.0 (g/dL)   HCT 04.5  40.9 - 81.1 (%)   MCV 88.0  78.0 - 100.0 (fL)   MCH 30.5  26.0 - 34.0 (pg)   MCHC 34.6  30.0 - 36.0 (g/dL)   RDW 91.4  78.2 - 95.6 (%)   Platelets 192  150 - 400 (K/uL)  COMPREHENSIVE METABOLIC PANEL      Component Value Range   Sodium 139  135 - 145 (mEq/L)   Potassium 3.8  3.5 - 5.1 (mEq/L)   Chloride 102  96 - 112 (mEq/L)   CO2 27  19 - 32 (mEq/L)   Glucose, Bld 100 (*) 70 - 99 (mg/dL)   BUN 14  6 - 23 (mg/dL)   Creatinine, Ser 2.13  0.50 - 1.35 (mg/dL)   Calcium 9.8  8.4 - 08.6 (mg/dL)   Total Protein 7.7  6.0 - 8.3 (g/dL)   Albumin 4.4  3.5 - 5.2 (g/dL)   AST 22  0 - 37 (U/L)   ALT 29  0 - 53 (U/L)   Alkaline Phosphatase 67  39 - 117 (U/L)   Total Bilirubin 0.9  0.3 - 1.2 (mg/dL)   GFR calc non Af Amer >60  >60 (mL/min)   GFR calc Af Amer >60  >60 (mL/min)  ETHANOL      Component Value Range   Alcohol, Ethyl (B) <11  0 - 11 (mg/dL)  URINE RAPID DRUG SCREEN (HOSP PERFORMED)      Component Value Range   Opiates NONE DETECTED  NONE DETECTED    Cocaine NONE DETECTED  NONE DETECTED    Benzodiazepines NONE DETECTED  NONE DETECTED    Amphetamines NONE DETECTED  NONE DETECTED    Tetrahydrocannabinol POSITIVE (*) NONE DETECTED    Barbiturates NONE DETECTED  NONE DETECTED   URINALYSIS, ROUTINE W REFLEX MICROSCOPIC      Component Value Range   Color, Urine YELLOW  YELLOW    APPearance CLEAR  CLEAR     Specific Gravity, Urine 1.023  1.005 - 1.030    pH 6.0  5.0 - 8.0    Glucose, UA NEGATIVE  NEGATIVE (mg/dL)   Hgb urine dipstick NEGATIVE  NEGATIVE  Bilirubin Urine NEGATIVE  NEGATIVE    Ketones, ur NEGATIVE  NEGATIVE (mg/dL)   Protein, ur NEGATIVE  NEGATIVE (mg/dL)   Urobilinogen, UA 1.0  0.0 - 1.0 (mg/dL)   Nitrite NEGATIVE  NEGATIVE    Leukocytes, UA NEGATIVE  NEGATIVE    Dg Chest 2 View  11/06/2011  *RADIOLOGY REPORT*  Clinical Data: Left chest pain  CHEST - 2 VIEW  Comparison:  02/23/2009  Findings:  The heart size and mediastinal contours are within normal limits.  Both lungs are clear.  The visualized skeletal structures are unremarkable.  IMPRESSION: No active cardiopulmonary disease.  Original Report Authenticated By: Judie Petit. Ruel Favors, M.D.   Dg Shoulder Left  11/05/2011  *RADIOLOGY REPORT*  Clinical Data: Left shoulder pain from football injury.  Limited range of motion and left shoulder swelling.  LEFT SHOULDER - 2+ VIEW  Comparison: None.  Findings: There is no evidence of fracture or dislocation.  The left humeral head is seated within the glenoid fossa.  The acromioclavicular joint is unremarkable in appearance.  No significant soft tissue abnormalities are seen.  The visualized portions of the left lung are clear.  IMPRESSION: No evidence of fracture or dislocation.  Original Report Authenticated By: Tonia Ghent, M.D.     No diagnosis found.    MDM    patient here complaining of a rash. He states it started on if he ends this moved up to his arms his axilla his chest and now he's back as well. States he has used steroids, steroid cream, and now most recently for the last week he has been using permethrin cream. On exam the rash is very characteristic of a severe scabies outbreak. Explained to the patient that using the permethrin cream did not take the itching away immediately. Lahey is going to need to use multiple rounds of the permethrin cream. There is no sign of  secondary bacterial infection. He did call dermatology however they are not accepting new patients to the beginning of the year. Discussed with him stopping the permethrin cream for the next few days and then restarting at it to kill all the eggs. Will give them a Solu-Medrol shot today for itching.    secondly patient's complaining of chest pain. He was wrestling with plans last night and states he felt something pop and crack in his left mid rib cage. He says ibuprofen is not helping the pain. Denies any shortness of breath and normal sats on room air. Will get a chest x-ray to further evaluate.     2:07 PM  chest x-ray within normal limits. Will DC home.  Will need to followup with dermatology.    Gwyneth Sprout, MD 11/06/11 [phone]

## 2011-11-06 NOTE — ED Notes (Signed)
Pt c/o painful rash to entire body, with scabs, for months. States he has been treated for 4 different skin problems with no relief

## 2011-11-12 ENCOUNTER — Encounter (HOSPITAL_COMMUNITY): Payer: Self-pay | Admitting: *Deleted

## 2011-11-12 ENCOUNTER — Emergency Department (HOSPITAL_COMMUNITY)
Admission: EM | Admit: 2011-11-12 | Discharge: 2011-11-12 | Disposition: A | Discharge status: Home / Self Care | Payer: Medicaid Other | Attending: Emergency Medicine | Admitting: Emergency Medicine

## 2011-11-12 DIAGNOSIS — F909 Attention-deficit hyperactivity disorder, unspecified type: Secondary | ICD-10-CM | POA: Insufficient documentation

## 2011-11-12 DIAGNOSIS — L2989 Other pruritus: Secondary | ICD-10-CM | POA: Insufficient documentation

## 2011-11-12 DIAGNOSIS — Z79899 Other long term (current) drug therapy: Secondary | ICD-10-CM | POA: Insufficient documentation

## 2011-11-12 DIAGNOSIS — R21 Rash and other nonspecific skin eruption: Secondary | ICD-10-CM | POA: Insufficient documentation

## 2011-11-12 DIAGNOSIS — B86 Scabies: Secondary | ICD-10-CM | POA: Insufficient documentation

## 2011-11-12 DIAGNOSIS — F3130 Bipolar disorder, current episode depressed, mild or moderate severity, unspecified: Secondary | ICD-10-CM | POA: Insufficient documentation

## 2011-11-12 DIAGNOSIS — L298 Other pruritus: Secondary | ICD-10-CM | POA: Insufficient documentation

## 2011-11-12 HISTORY — DX: Scabies: B86

## 2011-11-12 HISTORY — DX: Bipolar disorder, unspecified: F31.9

## 2011-11-12 MED ORDER — PREDNISONE 10 MG PO TABS: 20.0000 mg | ORAL_TABLET | Freq: Every day | ORAL | Status: DC

## 2011-11-12 MED ORDER — METHYLPREDNISOLONE SODIUM SUCC 125 MG IJ SOLR
125.0000 mg | Freq: Once | INTRAMUSCULAR | Status: AC
  Administered 2011-11-12: 125 mg via INTRAMUSCULAR
  Filled 2011-11-12: qty 2

## 2011-11-12 NOTE — ED Provider Notes (Signed)
Medical screening examination/treatment/procedure(s) were performed by non-physician practitioner and as supervising physician I was immediately available for consultation/collaboration.  Ethelda Chick, MD 11/12/11 [phone]

## 2011-11-12 NOTE — ED Notes (Signed)
Follow up for scabies. Request hydrocortisone shot and prednisone.

## 2011-11-12 NOTE — ED Provider Notes (Signed)
History     CSN: [id number] Arrival date & time: 11/12/2011  7:25 AM   First MD Initiated Contact with Patient 11/12/11 0745     8:02 AM HPI Patient reports she was seen on 11/06/2011 for a severe rash. Diagnosed with scabies and given a prescription of prednisone. Reports are to prescribe permethrin. Reports rash is significantly improved but is here for a recheck. Requesting to have another prescription of prednisone as it helps his itching. Patient is a 20 y.o. male presenting with rash. The history is provided by the patient.  Rash  This is a chronic problem. Episode onset: Several week. The problem has been gradually improving. There has been no fever. The rash is present on the torso, abdomen, left arm, left upper leg, left lower leg, right arm, right upper leg and right lower leg. The patient is experiencing no pain. Associated symptoms include itching. He has tried steriods (Prednisone, permethrin.) for the symptoms. The treatment provided moderate relief.    Past Medical History  Diagnosis Date  . Eczema   . ADHD (attention deficit hyperactivity disorder)   . Depression   . Scabies   . ADHD (attention deficit hyperactivity disorder)   . Bipolar 1 disorder   . Asthma     No past surgical history on file.  No family history on file.  History  Substance Use Topics  . Smoking status: Never Smoker   . Smokeless tobacco: Not on file  . Alcohol Use: No      Review of Systems  Constitutional: Negative for fever and chills.  Skin: Positive for itching and rash.  All other systems reviewed and are negative.    Allergies  Review of patient's allergies indicates no known allergies.  Home Medications   Current Outpatient Rx  Name Route Sig Dispense Refill  . LISDEXAMFETAMINE DIMESYLATE 40 MG PO CAPS Oral Take 40 mg by mouth every morning.     [name] PAROXETINE HCL 20 MG PO TABS Oral Take 20 mg by mouth every morning.      [name] PREDNISONE 20 MG PO TABS Oral Take 2 tablets (40  mg total) by mouth daily. 10 tablet 0    BP 136/66  Pulse 79  Temp(Src) 97.7 F (36.5 C) (Oral)  Resp 18  SpO2 99%  Physical Exam  Constitutional: He is oriented to person, place, and time. He appears well-developed and well-nourished.  HENT:  Head: Normocephalic and atraumatic.  Eyes: Pupils are equal, round, and reactive to light.  Neurological: He is alert and oriented to person, place, and time.  Skin: Skin is warm and dry. Rash noted. No erythema. No pallor.       Patient with typical burrows consistent with scabies on bilateral arms, bilateral legs, abdomen and torso. Patient reports improvement of symptoms but continued pruritus.  Psychiatric: He has a normal mood and affect. His behavior is normal.    ED Course  Procedures   MDM   Advised patient we can prescribe him or prednisone however advise dangers of chronic prednisone use. Encouraged patient to keep followup with dermatologist and continued use of permethrin cream.       Thomasene Lot, PA 11/12/11 [phone]

## 2011-11-19 ENCOUNTER — Emergency Department (HOSPITAL_COMMUNITY): Payer: Medicaid Other

## 2011-11-19 ENCOUNTER — Encounter (HOSPITAL_COMMUNITY): Payer: Self-pay | Admitting: Emergency Medicine

## 2011-11-19 ENCOUNTER — Emergency Department (HOSPITAL_COMMUNITY)
Admission: EM | Admit: 2011-11-19 | Discharge: 2011-11-19 | Disposition: A | Discharge status: Home / Self Care | Payer: Medicaid Other | Attending: Emergency Medicine | Admitting: Emergency Medicine

## 2011-11-19 DIAGNOSIS — J45909 Unspecified asthma, uncomplicated: Secondary | ICD-10-CM | POA: Insufficient documentation

## 2011-11-19 DIAGNOSIS — R220 Localized swelling, mass and lump, head: Secondary | ICD-10-CM | POA: Insufficient documentation

## 2011-11-19 DIAGNOSIS — F3130 Bipolar disorder, current episode depressed, mild or moderate severity, unspecified: Secondary | ICD-10-CM | POA: Insufficient documentation

## 2011-11-19 DIAGNOSIS — F909 Attention-deficit hyperactivity disorder, unspecified type: Secondary | ICD-10-CM | POA: Insufficient documentation

## 2011-11-19 DIAGNOSIS — R221 Localized swelling, mass and lump, neck: Secondary | ICD-10-CM | POA: Insufficient documentation

## 2011-11-19 DIAGNOSIS — S022XXA Fracture of nasal bones, initial encounter for closed fracture: Secondary | ICD-10-CM | POA: Insufficient documentation

## 2011-11-19 DIAGNOSIS — Z79899 Other long term (current) drug therapy: Secondary | ICD-10-CM | POA: Insufficient documentation

## 2011-11-19 MED ORDER — IBUPROFEN 800 MG PO TABS
800.0000 mg | ORAL_TABLET | Freq: Once | ORAL | Status: AC
  Administered 2011-11-19: 800 mg via ORAL
  Filled 2011-11-19: qty 1

## 2011-11-19 MED ORDER — OXYCODONE-ACETAMINOPHEN 5-325 MG PO TABS
1.0000 | ORAL_TABLET | Freq: Once | ORAL | Status: AC
  Administered 2011-11-19: 1 via ORAL

## 2011-11-19 MED ORDER — OXYCODONE-ACETAMINOPHEN 5-325 MG PO TABS
2.0000 | ORAL_TABLET | Freq: Once | ORAL | Status: DC
  Filled 2011-11-19: qty 1

## 2011-11-19 MED ORDER — OXYCODONE-ACETAMINOPHEN 5-325 MG PO TABS: 1.0000 | ORAL_TABLET | ORAL | Status: DC | PRN
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
## 2011-11-19 NOTE — ED Notes (Signed)
Assumed care of pt.  No distress noted, pt noted to be talking on his cell phone.  Pt taken to CT scan.

## 2011-11-19 NOTE — ED Provider Notes (Signed)
Medical screening examination/treatment/procedure(s) were performed by non-physician practitioner and as supervising physician I was immediately available for consultation/collaboration.  Donnetta Hutching, MD 11/19/11 [phone]

## 2011-11-19 NOTE — ED Notes (Signed)
Pt voiced understanding to f/u with surgeon in 10 days. Rx x 1.
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
## 2011-11-25 ENCOUNTER — Emergency Department (HOSPITAL_COMMUNITY)
Admission: EM | Admit: 2011-11-25 | Discharge: 2011-11-26 | Disposition: A | Discharge status: Home / Self Care | Payer: Medicaid Other | Attending: Emergency Medicine | Admitting: Emergency Medicine

## 2011-11-25 ENCOUNTER — Encounter (HOSPITAL_COMMUNITY): Payer: Self-pay | Admitting: *Deleted

## 2011-11-25 DIAGNOSIS — F909 Attention-deficit hyperactivity disorder, unspecified type: Secondary | ICD-10-CM | POA: Insufficient documentation

## 2011-11-25 DIAGNOSIS — J45909 Unspecified asthma, uncomplicated: Secondary | ICD-10-CM | POA: Insufficient documentation

## 2011-11-25 DIAGNOSIS — Z79899 Other long term (current) drug therapy: Secondary | ICD-10-CM | POA: Insufficient documentation

## 2011-11-25 DIAGNOSIS — R0981 Nasal congestion: Secondary | ICD-10-CM

## 2011-11-25 DIAGNOSIS — J3489 Other specified disorders of nose and nasal sinuses: Secondary | ICD-10-CM | POA: Insufficient documentation

## 2011-11-25 DIAGNOSIS — R21 Rash and other nonspecific skin eruption: Secondary | ICD-10-CM | POA: Insufficient documentation

## 2011-11-25 DIAGNOSIS — L298 Other pruritus: Secondary | ICD-10-CM | POA: Insufficient documentation

## 2011-11-25 DIAGNOSIS — B86 Scabies: Secondary | ICD-10-CM | POA: Insufficient documentation

## 2011-11-25 DIAGNOSIS — L2989 Other pruritus: Secondary | ICD-10-CM | POA: Insufficient documentation

## 2011-11-25 DIAGNOSIS — F3130 Bipolar disorder, current episode depressed, mild or moderate severity, unspecified: Secondary | ICD-10-CM | POA: Insufficient documentation

## 2011-11-25 NOTE — ED Notes (Signed)
C/o multiple sx, rash & itching, relates to scabies, scratching at this time, also "hard to breathe thru nose", "needs to be home by 0100". Onset of sx 12/15.

## 2011-11-26 MED ORDER — DIPHENHYDRAMINE HCL 50 MG/ML IJ SOLN: 50.0000 mg | Freq: Once | INTRAMUSCULAR | Status: DC

## 2011-11-26 MED ORDER — PERMETHRIN 5 % EX CREA: TOPICAL_CREAM | CUTANEOUS | Status: AC

## 2011-11-26 NOTE — ED Provider Notes (Signed)
Medical screening examination/treatment/procedure(s) were performed by non-physician practitioner and as supervising physician I was immediately available for consultation/collaboration.   Chrisha Vogel W Moneisha Vosler, MD 11/26/11 0717 

## 2011-11-26 NOTE — ED Notes (Signed)
Pt seen & dispositioned by EDNP prior to RN asessment, see NP notes, orders received. No meds given. Given Rx x1. No changes. Alert, NAD, calm, interactive, skin W&D, scratching,bites/rash.pt driving home.

## 2011-11-26 NOTE — ED Notes (Signed)
D/c'd at 01:08.

## 2011-11-26 NOTE — ED Provider Notes (Signed)
History     CSN: [id number]  Arrival date & time 11/25/11  2246   First MD Initiated Contact with Patient 11/26/11 0015      Chief Complaint  Patient presents with  . Pruritis  . Rash     Patient is a 20 y.o. male presenting with rash. The history is provided by the patient.  Rash  The current episode started more than 2 days ago. The problem has been gradually worsening. There has been no fever. The pain is at a severity of 0/10.   patient reports several days of itchy rash to torso upper and lower extremities. States recent history of scabies , was treated, seemed to improve, but believes they have returned. Also reports several day history of sinus congestion.  Past Medical History  Diagnosis Date  . Eczema   . ADHD (attention deficit hyperactivity disorder)   . Depression   . Scabies   . ADHD (attention deficit hyperactivity disorder)   . Bipolar 1 disorder   . Asthma     History reviewed. No pertinent past surgical history.  History reviewed. No pertinent family history.  History  Substance Use Topics  . Smoking status: Never Smoker   . Smokeless tobacco: Not on file  . Alcohol Use: No      Review of Systems  Constitutional: Negative.   HENT: Negative.   Eyes: Negative.   Respiratory: Negative.   Cardiovascular: Negative.   Gastrointestinal: Negative.   Genitourinary: Negative.   Musculoskeletal: Negative.   Skin: Positive for rash.  Neurological: Negative.   Hematological: Negative.   Psychiatric/Behavioral: Negative.     Allergies  Review of patient's allergies indicates no known allergies.  Home Medications   Current Outpatient Rx  Name Route Sig Dispense Refill  . LISDEXAMFETAMINE DIMESYLATE 40 MG PO CAPS Oral Take 40 mg by mouth every morning.     . OXYCODONE-ACETAMINOPHEN 5-325 MG PO TABS Oral Take 1 tablet by mouth every 4 (four) hours as needed. For pain.     [name] PAROXETINE HCL 20 MG PO TABS Oral Take 20 mg by mouth every morning.        [name] PERMETHRIN 5 % EX CREA  Apply to affected area once from neck to soles of feet. Wash off after 12-14 hours May have to repeat in 1014 days 10 g 1    BP 134/73  Pulse 70  Temp(Src) 97.7 F (36.5 C) (Oral)  Resp 16  SpO2 96%  Physical Exam  Constitutional: He appears well-developed and well-nourished.  HENT:  Head: Normocephalic and atraumatic.  Eyes: Conjunctivae are normal.  Neck: Neck supple.  Cardiovascular: Normal rate and regular rhythm.   Pulmonary/Chest: Effort normal and breath sounds normal.  Abdominal: Soft. Bowel sounds are normal.  Musculoskeletal: Normal range of motion.  Neurological: He is alert.  Skin: Skin is warm and dry.       Scattered mildly erythematous, crusty lesions noted to torso, BUE's and BLE's w/ superficial burrows noted between fingers and ac sites.  Psychiatric: He has a normal mood and affect.    ED Course  Procedures findings and impression discussed with patient. Patient is agreeable with plan.  Labs Reviewed - No data to display No results found.   1. Scabies   2. Sinus congestion       MDM  HPI/PE and findings c/w scabies (and sinus congestion).        Leanne Chang, NP 11/26/11 [phone]

## 2012-03-24 ENCOUNTER — Emergency Department (HOSPITAL_COMMUNITY)
Admission: EM | Admit: 2012-03-24 | Discharge: 2012-03-24 | Disposition: A | Discharge status: Home / Self Care | Payer: Medicaid Other | Attending: Emergency Medicine | Admitting: Emergency Medicine

## 2012-03-24 ENCOUNTER — Encounter (HOSPITAL_COMMUNITY): Payer: Self-pay | Admitting: *Deleted

## 2012-03-24 DIAGNOSIS — F319 Bipolar disorder, unspecified: Secondary | ICD-10-CM | POA: Insufficient documentation

## 2012-03-24 DIAGNOSIS — X503XXA Overexertion from repetitive movements, initial encounter: Secondary | ICD-10-CM | POA: Insufficient documentation

## 2012-03-24 DIAGNOSIS — R109 Unspecified abdominal pain: Secondary | ICD-10-CM | POA: Insufficient documentation

## 2012-03-24 DIAGNOSIS — S39012A Strain of muscle, fascia and tendon of lower back, initial encounter: Secondary | ICD-10-CM

## 2012-03-24 DIAGNOSIS — S335XXA Sprain of ligaments of lumbar spine, initial encounter: Secondary | ICD-10-CM | POA: Insufficient documentation

## 2012-03-24 DIAGNOSIS — J45909 Unspecified asthma, uncomplicated: Secondary | ICD-10-CM | POA: Insufficient documentation

## 2012-03-24 DIAGNOSIS — Y93B3 Activity, free weights: Secondary | ICD-10-CM | POA: Insufficient documentation

## 2012-03-24 LAB — URINALYSIS, ROUTINE W REFLEX MICROSCOPIC
Glucose, UA: NEGATIVE mg/dL
Ketones, ur: 15 mg/dL — AB
Leukocytes, UA: NEGATIVE
Protein, ur: NEGATIVE mg/dL

## 2012-03-24 MED ORDER — TRAMADOL HCL 50 MG PO TABS: 50.0000 mg | ORAL_TABLET | Freq: Four times a day (QID) | ORAL | Status: AC | PRN

## 2012-03-24 MED ORDER — TRAMADOL HCL 50 MG PO TABS
50.0000 mg | ORAL_TABLET | Freq: Once | ORAL | Status: AC
  Administered 2012-03-24: 50 mg via ORAL
  Filled 2012-03-24: qty 1

## 2012-03-24 MED ORDER — CYCLOBENZAPRINE HCL 10 MG PO TABS: 10.0000 mg | ORAL_TABLET | Freq: Two times a day (BID) | ORAL | Status: AC | PRN
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
## 2012-03-24 NOTE — Discharge Instructions (Signed)
Low Back Sprain with Rehab  A sprain is an injury in which a ligament is torn. The ligaments of the lower back are vulnerable to sprains. However, they are strong and require great force to be injured. These ligaments are important for stabilizing the spinal column. Sprains are classified into three categories. Grade 1 sprains cause pain, but the tendon is not lengthened. Grade 2 sprains include a lengthened ligament, due to the ligament being stretched or partially ruptured. With grade 2 sprains there is still function, although the function may be decreased. Grade 3 sprains involve a complete tear of the tendon or muscle, and function is usually impaired. SYMPTOMS   Severe pain in the lower back.   Sometimes, a feeling of a "pop," "snap," or tear, at the time of injury.   Tenderness and sometimes swelling at the injury site.   Uncommonly, bruising (contusion) within 48 hours of injury.   Muscle spasms in the back.  CAUSES  Low back sprains occur when a force is placed on the ligaments that is greater than they can handle. Common causes of injury include:  Performing a stressful act while off-balance.   Repetitive stressful activities that involve movement of the lower back.   Direct hit (trauma) to the lower back.  RISK INCREASES WITH:  Contact sports (football, wrestling).   Collisions (major skiing accidents).   Sports that require throwing or lifting (baseball, weightlifting).   Sports involving twisting of the spine (gymnastics, diving, tennis, golf).   Poor strength and flexibility.   Inadequate protection.   Previous back injury or surgery (especially fusion).  PREVENTION  Wear properly fitted and padded protective equipment.   Warm up and stretch properly before activity.   Allow for adequate recovery between workouts.   Maintain physical fitness:   Strength, flexibility, and endurance.   Cardiovascular fitness.   Maintain a healthy body weight.  PROGNOSIS   If treated properly, low back sprains usually heal with non-surgical treatment. The length of time for healing depends on the severity of the injury.  RELATED COMPLICATIONS   Recurring symptoms, resulting in a chronic problem.   Chronic inflammation and pain in the low back.   Delayed healing or resolution of symptoms, especially if activity is resumed too soon.   Prolonged impairment.   Unstable or arthritic joints of the low back.  TREATMENT  Treatment first involves the use of ice and medicine, to reduce pain and inflammation. The use of strengthening and stretching exercises may help reduce pain with activity. These exercises may be performed at home or with a therapist. Severe injuries may require referral to a therapist for further evaluation and treatment, such as ultrasound. Your caregiver may advise that you wear a back brace or corset, to help reduce pain and discomfort. Often, prolonged bed rest results in greater harm then benefit. Corticosteroid injections may be recommended. However, these should be reserved for the most serious cases. It is important to avoid using your back when lifting objects. At night, sleep on your back on a firm mattress, with a pillow placed under your knees. If non-surgical treatment is unsuccessful, surgery may be needed.  MEDICATION   If pain medicine is needed, nonsteroidal anti-inflammatory medicines (aspirin and ibuprofen), or other minor pain relievers (acetaminophen), are often advised.   Do not take pain medicine for 7 days before surgery.   Prescription pain relievers may be given, if your caregiver thinks they are needed. Use only as directed and only as much as you   need.   Ointments applied to the skin may be helpful.   Corticosteroid injections may be given by your caregiver. These injections should be reserved for the most serious cases, because they may only be given a certain number of times.  HEAT AND COLD  Cold treatment (icing)  should be applied for 10 to 15 minutes every 2 to 3 hours for inflammation and pain, and immediately after activity that aggravates your symptoms. Use ice packs or an ice massage.   Heat treatment may be used before performing stretching and strengthening activities prescribed by your caregiver, physical therapist, or athletic trainer. Use a heat pack or a warm water soak.  SEEK MEDICAL CARE IF:   Symptoms get worse or do not improve in 2 to 4 weeks, despite treatment.   You develop numbness or weakness in either leg.   You lose bowel or bladder function.   Any of the following occur after surgery: fever, increased pain, swelling, redness, drainage of fluids, or bleeding in the affected area.   New, unexplained symptoms develop. (Drugs used in treatment may produce side effects.)  EXERCISES  RANGE OF MOTION (ROM) AND STRETCHING EXERCISES - Low Back Sprain Most people with lower back pain will find that their symptoms get worse with excessive bending forward (flexion) or arching at the lower back (extension). The exercises that will help resolve your symptoms will focus on the opposite motion.  Your physician, physical therapist or athletic trainer will help you determine which exercises will be most helpful to resolve your lower back pain. Do not complete any exercises without first consulting with your caregiver. Discontinue any exercises which make your symptoms worse, until you speak to your caregiver. If you have pain, numbness or tingling which travels down into your buttocks, leg or foot, the goal of the therapy is for these symptoms to move closer to your back and eventually resolve. Sometimes, these leg symptoms will get better, but your lower back pain may worsen. This is often an indication of progress in your rehabilitation. Be very alert to any changes in your symptoms and the activities in which you participated in the 24 hours prior to the change. Sharing this information with your  caregiver will allow him or her to most efficiently treat your condition. These exercises may help you when beginning to rehabilitate your injury. Your symptoms may resolve with or without further involvement from your physician, physical therapist or athletic trainer. While completing these exercises, remember:   Restoring tissue flexibility helps normal motion to return to the joints. This allows healthier, less painful movement and activity.   An effective stretch should be held for at least 30 seconds.   A stretch should never be painful. You should only feel a gentle lengthening or release in the stretched tissue.  FLEXION RANGE OF MOTION AND STRETCHING EXERCISES: STRETCH - Flexion, Single Knee to Chest   Lie on a firm bed or floor with both legs extended in front of you.   Keeping one leg in contact with the floor, bring your opposite knee to your chest. Hold your leg in place by either grabbing behind your thigh or at your knee.   Pull until you feel a gentle stretch in your low back. Hold __________ seconds.   Slowly release your grasp and repeat the exercise with the opposite side.  Repeat __________ times. Complete this exercise __________ times per day.  STRETCH - Flexion, Double Knee to Chest  Lie on a firm bed or   floor with both legs extended in front of you.   Keeping one leg in contact with the floor, bring your opposite knee to your chest.   Tense your stomach muscles to support your back and then lift your other knee to your chest. Hold your legs in place by either grabbing behind your thighs or at your knees.   Pull both knees toward your chest until you feel a gentle stretch in your low back. Hold __________ seconds.   Tense your stomach muscles and slowly return one leg at a time to the floor.  Repeat __________ times. Complete this exercise __________ times per day.  STRETCH - Low Trunk Rotation  Lie on a firm bed or floor. Keeping your legs in front of you, bend  your knees so they are both pointed toward the ceiling and your feet are flat on the floor.   Extend your arms out to the side. This will stabilize your upper body by keeping your shoulders in contact with the floor.   Gently and slowly drop both knees together to one side until you feel a gentle stretch in your low back. Hold for __________ seconds.   Tense your stomach muscles to support your lower back as you bring your knees back to the starting position. Repeat the exercise to the other side.  Repeat __________ times. Complete this exercise __________ times per day  EXTENSION RANGE OF MOTION AND FLEXIBILITY EXERCISES: STRETCH - Extension, Prone on Elbows   Lie on your stomach on the floor, a bed will be too soft. Place your palms about shoulder width apart and at the height of your head.   Place your elbows under your shoulders. If this is too painful, stack pillows under your chest.   Allow your body to relax so that your hips drop lower and make contact more completely with the floor.   Hold this position for __________ seconds.   Slowly return to lying flat on the floor.  Repeat __________ times. Complete this exercise __________ times per day.  RANGE OF MOTION - Extension, Prone Press Ups  Lie on your stomach on the floor, a bed will be too soft. Place your palms about shoulder width apart and at the height of your head.   Keeping your back as relaxed as possible, slowly straighten your elbows while keeping your hips on the floor. You may adjust the placement of your hands to maximize your comfort. As you gain motion, your hands will come more underneath your shoulders.   Hold this position __________ seconds.   Slowly return to lying flat on the floor.  Repeat __________ times. Complete this exercise __________ times per day.  RANGE OF MOTION- Quadruped, Neutral Spine   Assume a hands and knees position on a firm surface. Keep your hands under your shoulders and your knees  under your hips. You may place padding under your knees for comfort.   Drop your head and point your tailbone toward the ground below you. This will round out your lower back like an angry cat. Hold this position for __________ seconds.   Slowly lift your head and release your tail bone so that your back sags into a large arch, like an old horse.   Hold this position for __________ seconds.   Repeat this until you feel limber in your low back.   Now, find your "sweet spot." This will be the most comfortable position somewhere between the two previous positions. This is your neutral spine.   Once you have found this position, tense your stomach muscles to support your low back.   Hold this position for __________ seconds.  Repeat __________ times. Complete this exercise __________ times per day.  STRENGTHENING EXERCISES - Low Back Sprain These exercises may help you when beginning to rehabilitate your injury. These exercises should be done near your "sweet spot." This is the neutral, low-back arch, somewhere between fully rounded and fully arched, that is your least painful position. When performed in this safe range of motion, these exercises can be used for people who have either a flexion or extension based injury. These exercises may resolve your symptoms with or without further involvement from your physician, physical therapist or athletic trainer. While completing these exercises, remember:   Muscles can gain both the endurance and the strength needed for everyday activities through controlled exercises.   Complete these exercises as instructed by your physician, physical therapist or athletic trainer. Increase the resistance and repetitions only as guided.   You may experience muscle soreness or fatigue, but the pain or discomfort you are trying to eliminate should never worsen during these exercises. If this pain does worsen, stop and make certain you are following the directions exactly.  If the pain is still present after adjustments, discontinue the exercise until you can discuss the trouble with your caregiver.  STRENGTHENING - Deep Abdominals, Pelvic Tilt   Lie on a firm bed or floor. Keeping your legs in front of you, bend your knees so they are both pointed toward the ceiling and your feet are flat on the floor.   Tense your lower abdominal muscles to press your low back into the floor. This motion will rotate your pelvis so that your tail bone is scooping upwards rather than pointing at your feet or into the floor.  With a gentle tension and even breathing, hold this position for __________ seconds. Repeat __________ times. Complete this exercise __________ times per day.  STRENGTHENING - Abdominals, Crunches   Lie on a firm bed or floor. Keeping your legs in front of you, bend your knees so they are both pointed toward the ceiling and your feet are flat on the floor. Cross your arms over your chest.   Slightly tip your chin down without bending your neck.   Tense your abdominals and slowly lift your trunk high enough to just clear your shoulder blades. Lifting higher can put excessive stress on the lower back and does not further strengthen your abdominal muscles.   Control your return to the starting position.  Repeat __________ times. Complete this exercise __________ times per day.  STRENGTHENING - Quadruped, Opposite UE/LE Lift   Assume a hands and knees position on a firm surface. Keep your hands under your shoulders and your knees under your hips. You may place padding under your knees for comfort.   Find your neutral spine and gently tense your abdominal muscles so that you can maintain this position. Your shoulders and hips should form a rectangle that is parallel with the floor and is not twisted.   Keeping your trunk steady, lift your right hand no higher than your shoulder and then your left leg no higher than your hip. Make sure you are not holding your  breath. Hold this position for __________ seconds.   Continuing to keep your abdominal muscles tense and your back steady, slowly return to your starting position. Repeat with the opposite arm and leg.  Repeat __________ times. Complete this exercise __________ times   per day.  STRENGTHENING - Abdominals and Quadriceps, Straight Leg Raise   Lie on a firm bed or floor with both legs extended in front of you.   Keeping one leg in contact with the floor, bend the other knee so that your foot can rest flat on the floor.   Find your neutral spine, and tense your abdominal muscles to maintain your spinal position throughout the exercise.   Slowly lift your straight leg off the floor about 6 inches for a count of 15, making sure to not hold your breath.   Still keeping your neutral spine, slowly lower your leg all the way to the floor.  Repeat this exercise with each leg __________ times. Complete this exercise __________ times per day. POSTURE AND BODY MECHANICS CONSIDERATIONS - Low Back Sprain Keeping correct posture when sitting, standing or completing your activities will reduce the stress put on different body tissues, allowing injured tissues a chance to heal and limiting painful experiences. The following are general guidelines for improved posture. Your physician or physical therapist will provide you with any instructions specific to your needs. While reading these guidelines, remember:  The exercises prescribed by your provider will help you have the flexibility and strength to maintain correct postures.   The correct posture provides the best environment for your joints to work. All of your joints have less wear and tear when properly supported by a spine with good posture. This means you will experience a healthier, less painful body.   Correct posture must be practiced with all of your activities, especially prolonged sitting and standing. Correct posture is as important when doing  repetitive low-stress activities (typing) as it is when doing a single heavy-load activity (lifting).  RESTING POSITIONS Consider which positions are most painful for you when choosing a resting position. If you have pain with flexion-based activities (sitting, bending, stooping, squatting), choose a position that allows you to rest in a less flexed posture. You would want to avoid curling into a fetal position on your side. If your pain worsens with extension-based activities (prolonged standing, working overhead), avoid resting in an extended position such as sleeping on your stomach. Most people will find more comfort when they rest with their spine in a more neutral position, neither too rounded nor too arched. Lying on a non-sagging bed on your side with a pillow between your knees, or on your back with a pillow under your knees will often provide some relief. Keep in mind, being in any one position for a prolonged period of time, no matter how correct your posture, can still lead to stiffness. PROPER SITTING POSTURE In order to minimize stress and discomfort on your spine, you must sit with correct posture. Sitting with good posture should be effortless for a healthy body. Returning to good posture is a gradual process. Many people can work toward this most comfortably by using various supports until they have the flexibility and strength to maintain this posture on their own. When sitting with proper posture, your ears will fall over your shoulders and your shoulders will fall over your hips. You should use the back of the chair to support your upper back. Your lower back will be in a neutral position, just slightly arched. You may place a small pillow or folded towel at the base of your lower back for  support.  When working at a desk, create an environment that supports good, upright posture. Without extra support, muscles tire, which leads to excessive   strain on joints and other tissues. Keep these  recommendations in mind: CHAIR:  A chair should be able to slide under your desk when your back makes contact with the back of the chair. This allows you to work closely.   The chair's height should allow your eyes to be level with the upper part of your monitor and your hands to be slightly lower than your elbows.  BODY POSITION  Your feet should make contact with the floor. If this is not possible, use a foot rest.   Keep your ears over your shoulders. This will reduce stress on your neck and low back.  INCORRECT SITTING POSTURES  If you are feeling tired and unable to assume a healthy sitting posture, do not slouch or slump. This puts excessive strain on your back tissues, causing more damage and pain. Healthier options include:  Using more support, like a lumbar pillow.   Switching tasks to something that requires you to be upright or walking.   Talking a brief walk.   Lying down to rest in a neutral-spine position.  PROLONGED STANDING WHILE SLIGHTLY LEANING FORWARD  When completing a task that requires you to lean forward while standing in one place for a long time, place either foot up on a stationary 2-4 inch high object to help maintain the best posture. When both feet are on the ground, the lower back tends to lose its slight inward curve. If this curve flattens (or becomes too large), then the back and your other joints will experience too much stress, tire more quickly, and can cause pain. CORRECT STANDING POSTURES Proper standing posture should be assumed with all daily activities, even if they only take a few moments, like when brushing your teeth. As in sitting, your ears should fall over your shoulders and your shoulders should fall over your hips. You should keep a slight tension in your abdominal muscles to brace your spine. Your tailbone should point down to the ground, not behind your body, resulting in an over-extended swayback posture.  INCORRECT STANDING POSTURES    Common incorrect standing postures include a forward head, locked knees and/or an excessive swayback. WALKING Walk with an upright posture. Your ears, shoulders and hips should all line-up. PROLONGED ACTIVITY IN A FLEXED POSITION When completing a task that requires you to bend forward at your waist or lean over a low surface, try to find a way to stabilize 3 out of 4 of your limbs. You can place a hand or elbow on your thigh or rest a knee on the surface you are reaching across. This will provide you more stability, so that your muscles do not tire as quickly. By keeping your knees relaxed, or slightly bent, you will also reduce stress across your lower back. CORRECT LIFTING TECHNIQUES DO :  Assume a wide stance. This will provide you more stability and the opportunity to get as close as possible to the object which you are lifting.   Tense your abdominals to brace your spine. Bend at the knees and hips. Keeping your back locked in a neutral-spine position, lift using your leg muscles. Lift with your legs, keeping your back straight.   Test the weight of unknown objects before attempting to lift them.   Try to keep your elbows locked down at your sides in order get the best strength from your shoulders when carrying an object.   Always ask for help when lifting heavy or awkward objects.  INCORRECT LIFTING TECHNIQUES DO   NOT:   Lock your knees when lifting, even if it is a small object.   Bend and twist. Pivot at your feet or move your feet when needing to change directions.   Assume that you can safely pick up even a paperclip without proper posture.  Document Released: 11/21/2005 Document Revised: 11/10/2011 Document Reviewed: 03/05/2009 [facility] Patient Information 2012 [place], [place].Lumbosacral Strain Lumbosacral strain is one of the most common causes of back pain. There are many causes of back pain. Most are not serious conditions. CAUSES  Your backbone (spinal column) is made  up of 24 main vertebral bodies, the sacrum, and the coccyx. These are held together by muscles and tough, fibrous tissue (ligaments). Nerve roots pass through the openings between the vertebrae. A sudden move or injury to the back may cause injury to, or pressure on, these nerves. This may result in localized back pain or pain movement (radiation) into the buttocks, down the leg, and into the foot. Sharp, shooting pain from the buttock down the back of the leg (sciatica) is frequently associated with a ruptured (herniated) disk. Pain may be caused by muscle spasm alone. Your caregiver can often find the cause of your pain by the details of your symptoms and an exam. In some cases, you may need tests (such as X-rays). Your caregiver will work with you to decide if any tests are needed based on your specific exam. HOME CARE INSTRUCTIONS   Avoid an underactive lifestyle. Active exercise, as directed by your caregiver, is your greatest weapon against back pain.   Avoid hard physical activities (tennis, racquetball, waterskiing) if you are not in proper physical condition for it. This may aggravate or create problems.   If you have a back problem, avoid sports requiring sudden body movements. Swimming and walking are generally safer activities.   Maintain good posture.   Avoid becoming overweight (obese).   Use bed rest for only the most extreme, sudden (acute) episode. Your caregiver will help you determine how much bed rest is necessary.   For acute conditions, you may put ice on the injured area.   Put ice in a plastic bag.   Place a towel between your skin and the bag.   Leave the ice on for 15 to 20 minutes at a time, every 2 hours, or as needed.   After you are improved and more active, it may help to apply heat for 30 minutes before activities.  See your caregiver if you are having pain that lasts longer than expected. Your caregiver can advise appropriate exercises or therapy if needed.  With conditioning, most back problems can be avoided. SEEK IMMEDIATE MEDICAL CARE IF:   You have numbness, tingling, weakness, or problems with the use of your arms or legs.   You experience severe back pain not relieved with medicines.   There is a change in bowel or bladder control.   You have increasing pain in any area of the body, including your belly (abdomen).   You notice shortness of breath, dizziness, or feel faint.   You feel sick to your stomach (nauseous), are throwing up (vomiting), or become sweaty.   You notice discoloration of your toes or legs, or your feet get very cold.   Your back pain is getting worse.   You have a fever.  MAKE SURE YOU:   Understand these instructions.   Will watch your condition.   Will get help right away if you are not doing well or get  worse.  Document Released: 08/31/2005 Document Revised: 11/10/2011 Document Reviewed: 02/20/2009 [facility] Patient Information 2012 [place], [place].

## 2012-03-24 NOTE — ED Notes (Signed)
Pt c/o bil flank pain off and on x's 1 week.  Describes pain as being sharpe.

## 2012-03-24 NOTE — ED Notes (Signed)
Pt c/o lt flank pain for one week. No blood in urine.

## 2012-03-25 NOTE — ED Provider Notes (Signed)
Medical screening examination/treatment/procedure(s) were performed by non-physician practitioner and as supervising physician I was immediately available for consultation/collaboration.   Eira Alpert III, MD 03/25/12 1624 

## 2012-08-13 ENCOUNTER — Encounter (HOSPITAL_COMMUNITY): Payer: Self-pay | Admitting: Emergency Medicine

## 2012-08-13 ENCOUNTER — Emergency Department (HOSPITAL_COMMUNITY)
Admission: EM | Admit: 2012-08-13 | Discharge: 2012-08-14 | Disposition: A | Discharge status: Home / Self Care | Payer: Medicaid Other | Attending: Emergency Medicine | Admitting: Emergency Medicine

## 2012-08-13 DIAGNOSIS — S022XXA Fracture of nasal bones, initial encounter for closed fracture: Secondary | ICD-10-CM | POA: Insufficient documentation

## 2012-08-13 DIAGNOSIS — F909 Attention-deficit hyperactivity disorder, unspecified type: Secondary | ICD-10-CM | POA: Insufficient documentation

## 2012-08-13 DIAGNOSIS — J45909 Unspecified asthma, uncomplicated: Secondary | ICD-10-CM | POA: Insufficient documentation

## 2012-08-13 DIAGNOSIS — R220 Localized swelling, mass and lump, head: Secondary | ICD-10-CM | POA: Insufficient documentation

## 2012-08-13 DIAGNOSIS — F319 Bipolar disorder, unspecified: Secondary | ICD-10-CM | POA: Insufficient documentation

## 2012-08-13 DIAGNOSIS — S1093XA Contusion of unspecified part of neck, initial encounter: Secondary | ICD-10-CM | POA: Insufficient documentation

## 2012-08-13 DIAGNOSIS — S0003XA Contusion of scalp, initial encounter: Secondary | ICD-10-CM | POA: Insufficient documentation

## 2012-08-13 DIAGNOSIS — Z79899 Other long term (current) drug therapy: Secondary | ICD-10-CM | POA: Insufficient documentation
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
## 2012-08-14 ENCOUNTER — Emergency Department (HOSPITAL_COMMUNITY): Payer: Medicaid Other

## 2012-08-14 MED ORDER — HYDROCODONE-ACETAMINOPHEN 5-500 MG PO TABS: 1.0000 | ORAL_TABLET | Freq: Four times a day (QID) | ORAL | Status: AC | PRN

## 2012-08-14 MED ORDER — OXYCODONE-ACETAMINOPHEN 5-325 MG PO TABS
1.0000 | ORAL_TABLET | Freq: Once | ORAL | Status: AC
  Administered 2012-08-14: 1 via ORAL
  Filled 2012-08-14: qty 1

## 2012-08-14 MED ORDER — IBUPROFEN 800 MG PO TABS
800.0000 mg | ORAL_TABLET | Freq: Once | ORAL | Status: AC
  Administered 2012-08-14: 800 mg via ORAL
  Filled 2012-08-14: qty 1

## 2012-08-14 NOTE — ED Notes (Signed)
Pt informed Tiffany PA of c/o teeth feeling like they are shifted backwards; Tiffany informed pt to see orthodontist first thing in the morning; pt voiced understanding; pt mother driving pt home at this time

## 2012-08-14 NOTE — ED Provider Notes (Signed)
Medical screening examination/treatment/procedure(s) were performed by non-physician practitioner and as supervising physician I was immediately available for consultation/collaboration.    Gladis Soley L Imanol Bihl, MD 08/14/12 1543 
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
## 2013-10-29 ENCOUNTER — Emergency Department (HOSPITAL_COMMUNITY)
Admission: EM | Admit: 2013-10-29 | Discharge: 2013-10-29 | Disposition: A | Discharge status: Home / Self Care | Payer: Medicaid Other | Attending: Emergency Medicine | Admitting: Emergency Medicine

## 2013-10-29 ENCOUNTER — Emergency Department (HOSPITAL_COMMUNITY): Payer: Medicaid Other

## 2013-10-29 ENCOUNTER — Encounter (HOSPITAL_COMMUNITY): Payer: Self-pay | Admitting: Emergency Medicine

## 2013-10-29 DIAGNOSIS — W503XXA Accidental bite by another person, initial encounter: Secondary | ICD-10-CM

## 2013-10-29 DIAGNOSIS — Z8619 Personal history of other infectious and parasitic diseases: Secondary | ICD-10-CM | POA: Insufficient documentation

## 2013-10-29 DIAGNOSIS — Z872 Personal history of diseases of the skin and subcutaneous tissue: Secondary | ICD-10-CM | POA: Insufficient documentation

## 2013-10-29 DIAGNOSIS — F909 Attention-deficit hyperactivity disorder, unspecified type: Secondary | ICD-10-CM | POA: Insufficient documentation

## 2013-10-29 DIAGNOSIS — J45909 Unspecified asthma, uncomplicated: Secondary | ICD-10-CM | POA: Insufficient documentation

## 2013-10-29 DIAGNOSIS — S0100XA Unspecified open wound of scalp, initial encounter: Secondary | ICD-10-CM | POA: Insufficient documentation

## 2013-10-29 DIAGNOSIS — R42 Dizziness and giddiness: Secondary | ICD-10-CM | POA: Insufficient documentation

## 2013-10-29 DIAGNOSIS — Z79899 Other long term (current) drug therapy: Secondary | ICD-10-CM | POA: Insufficient documentation

## 2013-10-29 DIAGNOSIS — F319 Bipolar disorder, unspecified: Secondary | ICD-10-CM | POA: Insufficient documentation

## 2013-10-29 DIAGNOSIS — S0990XA Unspecified injury of head, initial encounter: Secondary | ICD-10-CM | POA: Insufficient documentation

## 2013-10-29 MED ORDER — AMOXICILLIN-POT CLAVULANATE 875-125 MG PO TABS: 1.0000 | ORAL_TABLET | Freq: Two times a day (BID) | ORAL | Status: DC

## 2013-10-29 MED ORDER — MORPHINE SULFATE 4 MG/ML IJ SOLN: 4.0000 mg | Freq: Once | INTRAMUSCULAR | Status: DC

## 2013-10-29 MED ORDER — ONDANSETRON 4 MG PO TBDP
4.0000 mg | ORAL_TABLET | Freq: Once | ORAL | Status: AC
  Administered 2013-10-29: 4 mg via ORAL
  Filled 2013-10-29: qty 1

## 2013-10-29 MED ORDER — OXYCODONE-ACETAMINOPHEN 5-325 MG PO TABS
2.0000 | ORAL_TABLET | Freq: Once | ORAL | Status: AC
  Administered 2013-10-29: 2 via ORAL
  Filled 2013-10-29: qty 2

## 2013-10-29 MED ORDER — ONDANSETRON HCL 4 MG/2ML IJ SOLN: 4.0000 mg | Freq: Once | INTRAMUSCULAR | Status: DC
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
## 2013-11-01 NOTE — ED Provider Notes (Signed)
  Medical screening examination/treatment/procedure(s) were performed by non-physician practitioner and as supervising physician I was immediately available for consultation/collaboration.     Kaimen Peine, MD 11/01/13 0723 

## 2013-11-21 ENCOUNTER — Emergency Department (HOSPITAL_COMMUNITY): Payer: Medicaid Other

## 2013-11-21 ENCOUNTER — Emergency Department (HOSPITAL_COMMUNITY)
Admission: EM | Admit: 2013-11-21 | Discharge: 2013-11-21 | Disposition: A | Discharge status: Home / Self Care | Payer: Medicaid Other | Attending: Emergency Medicine | Admitting: Emergency Medicine

## 2013-11-21 ENCOUNTER — Encounter (HOSPITAL_COMMUNITY): Payer: Self-pay | Admitting: Emergency Medicine

## 2013-11-21 DIAGNOSIS — F909 Attention-deficit hyperactivity disorder, unspecified type: Secondary | ICD-10-CM | POA: Insufficient documentation

## 2013-11-21 DIAGNOSIS — S6990XA Unspecified injury of unspecified wrist, hand and finger(s), initial encounter: Secondary | ICD-10-CM | POA: Insufficient documentation

## 2013-11-21 DIAGNOSIS — M79641 Pain in right hand: Secondary | ICD-10-CM

## 2013-11-21 DIAGNOSIS — Z872 Personal history of diseases of the skin and subcutaneous tissue: Secondary | ICD-10-CM | POA: Insufficient documentation

## 2013-11-21 DIAGNOSIS — J45909 Unspecified asthma, uncomplicated: Secondary | ICD-10-CM | POA: Insufficient documentation

## 2013-11-21 DIAGNOSIS — Z79899 Other long term (current) drug therapy: Secondary | ICD-10-CM | POA: Insufficient documentation

## 2013-11-21 DIAGNOSIS — F319 Bipolar disorder, unspecified: Secondary | ICD-10-CM | POA: Insufficient documentation

## 2013-11-21 DIAGNOSIS — Z8619 Personal history of other infectious and parasitic diseases: Secondary | ICD-10-CM | POA: Insufficient documentation

## 2013-11-21 MED ORDER — OXYCODONE-ACETAMINOPHEN 5-325 MG PO TABS
2.0000 | ORAL_TABLET | Freq: Once | ORAL | Status: AC
  Administered 2013-11-21: 2 via ORAL
  Filled 2013-11-21: qty 2

## 2013-11-21 MED ORDER — TRAMADOL HCL 50 MG PO TABS: 50.0000 mg | ORAL_TABLET | Freq: Four times a day (QID) | ORAL | Status: DC | PRN

## 2013-11-21 NOTE — ED Notes (Signed)
PT ambulated with baseline gait; VSS; A&Ox3; no signs of distress; respirations even and unlabored; skin warm and dry; no questions upon discharge.  
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
## 2013-11-21 NOTE — ED Notes (Signed)
No answer when called 

## 2013-11-21 NOTE — ED Notes (Signed)
Called x's 3 with nor response
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
## 2013-11-23 NOTE — ED Provider Notes (Signed)
Medical screening examination/treatment/procedure(s) were performed by non-physician practitioner and as supervising physician I was immediately available for consultation/collaboration.  Mikaya Bunner Y. Jakaria Lavergne, MD 11/23/13 1639 

## 2014-03-15 ENCOUNTER — Encounter (HOSPITAL_COMMUNITY): Payer: Self-pay | Admitting: Emergency Medicine

## 2014-03-15 ENCOUNTER — Emergency Department (HOSPITAL_COMMUNITY): Payer: Medicaid Other

## 2014-03-15 ENCOUNTER — Emergency Department (HOSPITAL_COMMUNITY)
Admission: EM | Admit: 2014-03-15 | Discharge: 2014-03-15 | Disposition: A | Discharge status: Home / Self Care | Payer: Medicaid Other | Attending: Emergency Medicine | Admitting: Emergency Medicine

## 2014-03-15 DIAGNOSIS — IMO0002 Reserved for concepts with insufficient information to code with codable children: Secondary | ICD-10-CM | POA: Insufficient documentation

## 2014-03-15 DIAGNOSIS — M545 Low back pain, unspecified: Secondary | ICD-10-CM

## 2014-03-15 DIAGNOSIS — Y9389 Activity, other specified: Secondary | ICD-10-CM | POA: Insufficient documentation

## 2014-03-15 DIAGNOSIS — Z79899 Other long term (current) drug therapy: Secondary | ICD-10-CM | POA: Insufficient documentation

## 2014-03-15 DIAGNOSIS — Z872 Personal history of diseases of the skin and subcutaneous tissue: Secondary | ICD-10-CM | POA: Insufficient documentation

## 2014-03-15 DIAGNOSIS — F319 Bipolar disorder, unspecified: Secondary | ICD-10-CM | POA: Insufficient documentation

## 2014-03-15 DIAGNOSIS — W208XXA Other cause of strike by thrown, projected or falling object, initial encounter: Secondary | ICD-10-CM | POA: Insufficient documentation

## 2014-03-15 DIAGNOSIS — Y990 Civilian activity done for income or pay: Secondary | ICD-10-CM | POA: Insufficient documentation

## 2014-03-15 DIAGNOSIS — Y929 Unspecified place or not applicable: Secondary | ICD-10-CM | POA: Insufficient documentation

## 2014-03-15 DIAGNOSIS — J45909 Unspecified asthma, uncomplicated: Secondary | ICD-10-CM | POA: Insufficient documentation

## 2014-03-15 DIAGNOSIS — R52 Pain, unspecified: Secondary | ICD-10-CM | POA: Insufficient documentation

## 2014-03-15 DIAGNOSIS — F909 Attention-deficit hyperactivity disorder, unspecified type: Secondary | ICD-10-CM | POA: Insufficient documentation

## 2014-03-15 LAB — URINALYSIS, ROUTINE W REFLEX MICROSCOPIC
Bilirubin Urine: NEGATIVE
GLUCOSE, UA: NEGATIVE mg/dL
Hgb urine dipstick: NEGATIVE
Ketones, ur: NEGATIVE mg/dL
LEUKOCYTES UA: NEGATIVE
Nitrite: NEGATIVE
PROTEIN: NEGATIVE mg/dL
SPECIFIC GRAVITY, URINE: 1.019 (ref 1.005–1.030)
UROBILINOGEN UA: 0.2 mg/dL (ref 0.0–1.0)
pH: 7.5 (ref 5.0–8.0)

## 2014-03-15 MED ORDER — NAPROXEN 500 MG PO TABS: 500.0000 mg | ORAL_TABLET | Freq: Two times a day (BID) | ORAL | Status: DC

## 2014-03-15 MED ORDER — KETOROLAC TROMETHAMINE 60 MG/2ML IM SOLN
60.0000 mg | Freq: Once | INTRAMUSCULAR | Status: AC
  Administered 2014-03-15: 60 mg via INTRAMUSCULAR
  Filled 2014-03-15: qty 2

## 2014-03-15 NOTE — ED Provider Notes (Signed)
CSN: [id number]     Arrival date & time 03/15/14  1402 History   First MD Initiated Contact with Patient 03/15/14 1507     Chief Complaint  Patient presents with  . Back Pain  . Flank Pain     (Consider location/radiation/quality/duration/timing/severity/associated sxs/prior Treatment) Patient is a 23 y.o. male presenting with back pain and flank pain.  Back Pain Flank Pain   23 yo male presents with acute back pain onset yesterday a 1pm after having "50 lb box of frozen chicken" fall on his back while unloading a truck at work. Patient describes a sharp intermittent right sided lower back pain that is rated at 8/10. Patient states pain is worse with movement. Denies having taking anything for it. Denies personal hx of kidney stones but states his mother has hx of kidney stones. Patient denies any N/V, abdominal pain, chest pain, or SOB. Denies any urinary frequency, urgency, or hematuria. PMH significant for asthma, ADHD, bipolar, and depression.  Denies hx of cancer or IVDU. No lower extremity numbness, weakness or incontinence.  Past Medical History  Diagnosis Date  . Eczema   . ADHD (attention deficit hyperactivity disorder)   . Depression   . Scabies   . ADHD (attention deficit hyperactivity disorder)   . Bipolar 1 disorder   . Asthma    Past Surgical History  Procedure Laterality Date  . Eye surgery     Family History  Problem Relation Age of Onset  . Hypertension Other    History  Substance Use Topics  . Smoking status: Never Smoker   . Smokeless tobacco: Not on file  . Alcohol Use: No    Review of Systems  Genitourinary: Positive for flank pain.  Musculoskeletal: Positive for back pain.  All other systems reviewed and are negative.     Allergies  Review of patient's allergies indicates no known allergies.  Home Medications   Current Outpatient Rx  Name  Route  Sig  Dispense  Refill  . acetaminophen (TYLENOL) 500 MG tablet   Oral   Take 1,000 mg by  mouth every 6 (six) hours as needed for mild pain.         . ARIPiprazole (ABILIFY) 30 MG tablet   Oral   Take 30 mg by mouth at bedtime.          [name] ibuprofen (ADVIL,MOTRIN) 200 MG tablet   Oral   Take 200 mg by mouth every 6 (six) hours as needed for moderate pain.          [name] lisdexamfetamine (VYVANSE) 30 MG capsule   Oral   Take 30 mg by mouth daily after breakfast.         . naproxen (NAPROSYN) 500 MG tablet   Oral   Take 1 tablet (500 mg total) by mouth 2 (two) times daily with a meal.   30 tablet   0    BP 124/59  Pulse 54  Temp(Src) 98 F (36.7 C) (Oral)  Resp 20  SpO2 96% Physical Exam  Nursing note and vitals reviewed. Constitutional: He is oriented to person, place, and time. He appears well-developed and well-nourished. No distress.  HENT:  Head: Normocephalic and atraumatic.  Eyes: Conjunctivae are normal. No scleral icterus.  Neck: No JVD present. No tracheal deviation present.  Cardiovascular: Normal rate and regular rhythm.  Exam reveals no gallop and no friction rub.   No murmur heard. HR noted to be 101 on nursing notes. HR is WNL by my  exam.   Pulmonary/Chest: Effort normal and breath sounds normal. No respiratory distress. He has no wheezes. He has no rhonchi. He has no rales.  Abdominal: Soft. Bowel sounds are normal. He exhibits no distension. There is no hepatosplenomegaly. There is no tenderness. There is no rigidity, no rebound, no guarding, no tenderness at McBurney's point and negative Murphy's sign.  Musculoskeletal: Normal range of motion. He exhibits no edema.  No Midline Spinal tenderness. Negative SLR Bilaterally.  No Saddle anesthesia.  Good sensation to bilateral distal extremities.  Lower extremity strength 5/5 and equal bilaterally.   Neurological: He is alert and oriented to person, place, and time.  Skin: Skin is warm and dry. He is not diaphoretic.  Psychiatric: He has a normal mood and affect. His behavior is normal.     ED Course  Procedures (including critical care time) Labs Review Labs Reviewed  URINALYSIS, ROUTINE W REFLEX MICROSCOPIC   Imaging Review Dg Lumbar Spine Complete  03/15/2014   CLINICAL DATA:  Back pain and flank pain  EXAM: LUMBAR SPINE - COMPLETE 4+ VIEW  COMPARISON:  None.  FINDINGS: There is no evidence of lumbar spine fracture. Alignment is normal. Intervertebral disc spaces are maintained. Mild facet joint degenerative changes. Normal bowel gas pattern.  IMPRESSION: Mild facet joint degenerative changes of the lumbar spine. Otherwise, negative.   Electronically Signed   By: Britta MccreedySusan  Turner M.D.   On: 03/15/2014 15:32     EKG Interpretation None      MDM   Final diagnoses:  Low back pain   Patient afebrile with normal VS.  The patient has no upper back or neck pain, no fever, no hx of cancer, IVDU, recent spinal procedures, weakness or numbness of the lower extremities and no urinary complaints including no retention or incontinence  Pain appears MSK in nature. DDX includes lumbar strain, ureterolithiasis, spinal abscess, sciatica, and cauda equina.  UA negative for UTI or hgb. Doubt ureterolithiasis. No risk factors for spinal abscess. SLR negative, doubt sciatica. No evidence of cauda equina.   Will get plain films to rule out any acute bony abnormalities.   Plain films negative for any acute injury, fracture, or dislocation. Patient pain reassessed and noted to be markedly improved. Patient appears more comfortable. Plan to continue NSAID therapy and have patient follow up with his PCP in 1 week if pain fails to resolve with tx. Patient agrees with plan. Discharged in good condition.   Meds given in ED:  Medications  ketorolac (TORADOL) injection 60 mg (60 mg Intramuscular Given 03/15/14 1521)    Discharge Medication List as of 03/15/2014  3:47 PM    START taking these medications   Details  naproxen (NAPROSYN) 500 MG tablet Take 1 tablet (500 mg total) by mouth 2  (two) times daily with a meal., Starting 03/15/2014, Until Discontinued, Print           Rudene AndaJacob Gray Nahum Sherrer, PA-C 03/16/14 1343

## 2014-03-15 NOTE — Discharge Instructions (Signed)
Take medications as directed. Rest, ice, and mild stretches as tolerated. Follow up with your doctor in 1 week if symptoms do not improve with treatment.   Return to ED immediately if you develop difficulty urinating, fever, numbness or weakness.    Back Pain, Adult Back pain is very common. The pain often gets better over time. The cause of back pain is usually not dangerous. Most people can learn to manage their back pain on their own.  HOME CARE   Stay active. Start with short walks on flat ground if you can. Try to walk farther each day.  Do not sit, drive, or stand in one place for more than 30 minutes. Do not stay in bed.  Do not avoid exercise or work. Activity can help your back heal faster.  Be careful when you bend or lift an object. Bend at your knees, keep the object close to you, and do not twist.  Sleep on a firm mattress. Lie on your side, and bend your knees. If you lie on your back, put a pillow under your knees.  Only take medicines as told by your doctor.  Put ice on the injured area.  Put ice in a plastic bag.  Place a towel between your skin and the bag.  Leave the ice on for 15-20 minutes, 03-04 times a day for the first 2 to 3 days. After that, you can switch between ice and heat packs.  Ask your doctor about back exercises or massage.  Avoid feeling anxious or stressed. Find good ways to deal with stress, such as exercise. GET HELP RIGHT AWAY IF:   Your pain does not go away with rest or medicine.  Your pain does not go away in 1 week.  You have new problems.  You do not feel well.  The pain spreads into your legs.  You cannot control when you poop (bowel movement) or pee (urinate).  Your arms or legs feel weak or lose feeling (numbness).  You feel sick to your stomach (nauseous) or throw up (vomit).  You have belly (abdominal) pain.  You feel like you may pass out (faint). MAKE SURE YOU:   Understand these instructions.  Will watch  your condition.  Will get help right away if you are not doing well or get worse. Document Released: 05/09/2008 Document Revised: 02/13/2012 Document Reviewed: 04/11/2011 [facility] Patient Information 2014 [place], [place].

## 2014-03-15 NOTE — ED Notes (Signed)
Per pt, back pain starting since yesterday.  Pt had box fall on back yesterday.  States he also has had increased pain in back with urination.  Pt states ? Fever yesterday.

## 2014-03-16 NOTE — ED Provider Notes (Signed)
Medical screening examination/treatment/procedure(s) were conducted as a shared visit with non-physician practitioner(s) or resident and myself. I personally evaluated the patient during the encounter and agree with the findings and plan unless otherwise indicated.  I have personally reviewed any xrays and/ or EKG's with the provider and I agree with interpretation.  50 lb box fell on right lower back yesterday. Since then pain with movement. No weakness, numbness or b/b changes.  No fevers, stone hx or vomiting. No head injury. 5+ strength in UE and LE with f/e at major joints.  Sensation to palpation intact in UE and LE. CNs 2-12 grossly intact. Nl LE reflexes  Visual fields intact to finger testing. No midline vertebral pain.  Pt well appearing on exam, right lower lumbar paraspinal/ SI tenderness.  MSK lumbar strain Fup outpt discussed with NSAIDS/ work note.    Enid SkeensJoshua M Nazaret Chea, MD 03/16/14 [phone]

## 2015-03-24 ENCOUNTER — Emergency Department (HOSPITAL_COMMUNITY)
Admission: EM | Admit: 2015-03-24 | Discharge: 2015-03-24 | Disposition: A | Discharge status: Home / Self Care | Payer: Medicaid Other | Attending: Emergency Medicine | Admitting: Emergency Medicine

## 2015-03-24 ENCOUNTER — Encounter (HOSPITAL_COMMUNITY): Payer: Self-pay | Admitting: Emergency Medicine

## 2015-03-24 DIAGNOSIS — Y9289 Other specified places as the place of occurrence of the external cause: Secondary | ICD-10-CM | POA: Insufficient documentation

## 2015-03-24 DIAGNOSIS — Z791 Long term (current) use of non-steroidal anti-inflammatories (NSAID): Secondary | ICD-10-CM | POA: Insufficient documentation

## 2015-03-24 DIAGNOSIS — Y998 Other external cause status: Secondary | ICD-10-CM | POA: Insufficient documentation

## 2015-03-24 DIAGNOSIS — W1842XA Slipping, tripping and stumbling without falling due to stepping into hole or opening, initial encounter: Secondary | ICD-10-CM | POA: Diagnosis not present

## 2015-03-24 DIAGNOSIS — Z8619 Personal history of other infectious and parasitic diseases: Secondary | ICD-10-CM | POA: Diagnosis not present

## 2015-03-24 DIAGNOSIS — S39012A Strain of muscle, fascia and tendon of lower back, initial encounter: Secondary | ICD-10-CM

## 2015-03-24 DIAGNOSIS — Z872 Personal history of diseases of the skin and subcutaneous tissue: Secondary | ICD-10-CM | POA: Diagnosis not present

## 2015-03-24 DIAGNOSIS — Y9301 Activity, walking, marching and hiking: Secondary | ICD-10-CM | POA: Insufficient documentation

## 2015-03-24 DIAGNOSIS — F909 Attention-deficit hyperactivity disorder, unspecified type: Secondary | ICD-10-CM | POA: Diagnosis not present

## 2015-03-24 DIAGNOSIS — Z720 Tobacco use: Secondary | ICD-10-CM | POA: Diagnosis not present

## 2015-03-24 DIAGNOSIS — S3992XA Unspecified injury of lower back, initial encounter: Secondary | ICD-10-CM | POA: Diagnosis present

## 2015-03-24 DIAGNOSIS — F319 Bipolar disorder, unspecified: Secondary | ICD-10-CM | POA: Diagnosis not present

## 2015-03-24 DIAGNOSIS — Z79899 Other long term (current) drug therapy: Secondary | ICD-10-CM | POA: Diagnosis not present

## 2015-03-24 DIAGNOSIS — J45909 Unspecified asthma, uncomplicated: Secondary | ICD-10-CM | POA: Diagnosis not present

## 2015-03-24 MED ORDER — CYCLOBENZAPRINE HCL 10 MG PO TABS: 10.0000 mg | ORAL_TABLET | Freq: Two times a day (BID) | ORAL | Status: DC | PRN

## 2015-03-24 MED ORDER — MELOXICAM 15 MG PO TABS: 15.0000 mg | ORAL_TABLET | Freq: Every day | ORAL | Status: DC

## 2015-03-24 MED ORDER — KETOROLAC TROMETHAMINE 60 MG/2ML IM SOLN
60.0000 mg | Freq: Once | INTRAMUSCULAR | Status: AC
  Administered 2015-03-24: 60 mg via INTRAMUSCULAR
  Filled 2015-03-24: qty 2

## 2015-03-24 MED ORDER — CYCLOBENZAPRINE HCL 10 MG PO TABS
10.0000 mg | ORAL_TABLET | Freq: Once | ORAL | Status: AC
  Administered 2015-03-24: 10 mg via ORAL
  Filled 2015-03-24: qty 1

## 2015-03-24 NOTE — Discharge Instructions (Signed)
Take mobic as needed for pain. Take flexeril as needed for muscle spasm. You may take these medications together. Refer to attached documents for more information.  °

## 2015-03-24 NOTE — ED Provider Notes (Signed)
CSN: [id number]     Arrival date & time 03/24/15  4098 History   First MD Initiated Contact with Patient 03/24/15 [phone]     Chief Complaint  Patient presents with  . Back Pain     (Consider location/radiation/quality/duration/timing/severity/associated sxs/prior Treatment) HPI Comments: Patient is a 24 year old male who presents with sudden onset of lower back pain that started 1 week ago when he stepped in a hole and tried to catch his balance and "tweaked his back." The pain is sharp and severe and does not radiate. The pain is constant. Movement makes the pain worse. Nothing makes the pain better. Patient tried ibuprofen for pain without relief. No associated symptoms. No saddle paresthesias or bladder/bowel incontinence.     Patient is a 24 y.o. male presenting with back pain.  Back Pain Associated symptoms: no abdominal pain, no chest pain, no dysuria, no fever and no weakness     Past Medical History  Diagnosis Date  . Eczema   . ADHD (attention deficit hyperactivity disorder)   . Depression   . Scabies   . ADHD (attention deficit hyperactivity disorder)   . Bipolar 1 disorder   . Asthma    Past Surgical History  Procedure Laterality Date  . Eye surgery     Family History  Problem Relation Age of Onset  . Hypertension Other    History  Substance Use Topics  . Smoking status: Current Every Day Smoker    Types: Cigars  . Smokeless tobacco: Not on file  . Alcohol Use: No    Review of Systems  Constitutional: Negative for fever, chills and fatigue.  HENT: Negative for trouble swallowing.   Eyes: Negative for visual disturbance.  Respiratory: Negative for shortness of breath.   Cardiovascular: Negative for chest pain and palpitations.  Gastrointestinal: Negative for nausea, vomiting, abdominal pain and diarrhea.  Genitourinary: Negative for dysuria and difficulty urinating.  Musculoskeletal: Positive for back pain. Negative for arthralgias and neck pain.  Skin:  Negative for color change.  Neurological: Negative for dizziness and weakness.  Psychiatric/Behavioral: Negative for dysphoric mood.      Allergies  Review of patient's allergies indicates no known allergies.  Home Medications   Prior to Admission medications   Medication Sig Start Date End Date Taking? Authorizing Provider  acetaminophen (TYLENOL) 500 MG tablet Take 1,000 mg by mouth every 6 (six) hours as needed for mild pain.    Historical Provider, MD  ARIPiprazole (ABILIFY) 30 MG tablet Take 30 mg by mouth at bedtime.     Historical Provider, MD  ibuprofen (ADVIL,MOTRIN) 200 MG tablet Take 200 mg by mouth every 6 (six) hours as needed for moderate pain.     Historical Provider, MD  lisdexamfetamine (VYVANSE) 30 MG capsule Take 30 mg by mouth daily after breakfast.    Historical Provider, MD  naproxen (NAPROSYN) 500 MG tablet Take 1 tablet (500 mg total) by mouth 2 (two) times daily with a meal. 03/15/14   Cristobal Goldmann, PA-C   BP 145/65 mmHg  Pulse 81  Temp(Src) 97.8 F (36.6 C) (Oral)  Resp 24  SpO2 100% Physical Exam  Constitutional: He is oriented to person, place, and time. He appears well-developed and well-nourished. No distress.  HENT:  Head: Normocephalic and atraumatic.  Eyes: Conjunctivae and EOM are normal.  Neck: Normal range of motion.  Cardiovascular: Normal rate and regular rhythm.  Exam reveals no gallop and no friction rub.   No murmur heard. Pulmonary/Chest: Effort normal and  breath sounds normal. He has no wheezes. He has no rales. He exhibits no tenderness.  Abdominal: Soft. He exhibits no distension. There is no tenderness. There is no rebound.  Musculoskeletal: Normal range of motion.  No midline spine tenderness to palpation. Left lumbar paraspinal tenderness to palpation.   Neurological: He is alert and oriented to person, place, and time. Coordination normal.  Lower extremity strength and sensation equal and intact bilaterally. Speech is  goal-oriented. Moves limbs without ataxia.   Skin: Skin is warm and dry.  Psychiatric: He has a normal mood and affect. His behavior is normal.  Nursing note and vitals reviewed.   ED Course  Procedures (including critical care time) Labs Review Labs Reviewed - No data to display  Imaging Review No results found.   EKG Interpretation None      MDM   Final diagnoses:  Strain of lumbar paraspinal muscle, initial encounter   8:38 AM Patient will have toradol and flexeril here for pain. No bowel/bladder incontinence or saddle paresthesias. Vitals stable and patient afebrile.    Emilia BeckKaitlyn Tiffanni Scarfo, PA-C 03/24/15 1034  Gerhard Munchobert Lockwood, MD 03/27/15 [phone]

## 2015-03-24 NOTE — ED Notes (Signed)
Patient states hurt his lower back when walking and stepped in hole then "tried to catch my balance" x 1 week ago.   Patient states "I was taking ibuprofen 500 mg, but that didn't work".   Patient describes pain as "grabbing".

## 2015-06-15 ENCOUNTER — Emergency Department (HOSPITAL_COMMUNITY)
Admission: EM | Admit: 2015-06-15 | Discharge: 2015-06-15 | Disposition: A | Discharge status: Home / Self Care | Payer: Medicaid Other | Attending: Emergency Medicine | Admitting: Emergency Medicine

## 2015-06-15 ENCOUNTER — Encounter (HOSPITAL_COMMUNITY): Payer: Self-pay | Admitting: Emergency Medicine

## 2015-06-15 DIAGNOSIS — Z791 Long term (current) use of non-steroidal anti-inflammatories (NSAID): Secondary | ICD-10-CM | POA: Insufficient documentation

## 2015-06-15 DIAGNOSIS — Z872 Personal history of diseases of the skin and subcutaneous tissue: Secondary | ICD-10-CM | POA: Insufficient documentation

## 2015-06-15 DIAGNOSIS — F329 Major depressive disorder, single episode, unspecified: Secondary | ICD-10-CM | POA: Diagnosis not present

## 2015-06-15 DIAGNOSIS — Z79899 Other long term (current) drug therapy: Secondary | ICD-10-CM | POA: Insufficient documentation

## 2015-06-15 DIAGNOSIS — J45909 Unspecified asthma, uncomplicated: Secondary | ICD-10-CM | POA: Insufficient documentation

## 2015-06-15 DIAGNOSIS — Z720 Tobacco use: Secondary | ICD-10-CM | POA: Insufficient documentation

## 2015-06-15 DIAGNOSIS — Z8619 Personal history of other infectious and parasitic diseases: Secondary | ICD-10-CM | POA: Insufficient documentation

## 2015-06-15 DIAGNOSIS — F909 Attention-deficit hyperactivity disorder, unspecified type: Secondary | ICD-10-CM | POA: Insufficient documentation

## 2015-06-15 DIAGNOSIS — J029 Acute pharyngitis, unspecified: Secondary | ICD-10-CM

## 2015-06-15 LAB — RAPID STREP SCREEN (MED CTR MEBANE ONLY): Streptococcus, Group A Screen (Direct): NEGATIVE

## 2015-06-15 MED ORDER — ACETAMINOPHEN 325 MG PO TABS
650.0000 mg | ORAL_TABLET | Freq: Once | ORAL | Status: AC | PRN
  Administered 2015-06-15: 650 mg via ORAL
  Filled 2015-06-15: qty 2

## 2015-06-15 MED ORDER — IBUPROFEN 800 MG PO TABS: 800.0000 mg | ORAL_TABLET | Freq: Three times a day (TID) | ORAL | Status: DC

## 2015-06-15 MED ORDER — AMOXICILLIN 500 MG PO CAPS
500.0000 mg | ORAL_CAPSULE | Freq: Three times a day (TID) | ORAL | Status: DC
Start: 2015-06-15 — End: 2016-04-02

## 2015-06-15 NOTE — Discharge Instructions (Signed)
Ibuprofen for headache, pain and fever, you can also take Tylenol. Salt water gargles several times a day. Amoxicillin as prescribed until all gone. Follow with primary care doctor. Return if worsening symptoms.   Pharyngitis Pharyngitis is redness, pain, and swelling (inflammation) of your pharynx.  CAUSES  Pharyngitis is usually caused by infection. Most of the time, these infections are from viruses (viral) and are part of a cold. However, sometimes pharyngitis is caused by bacteria (bacterial). Pharyngitis can also be caused by allergies. Viral pharyngitis may be spread from person to person by coughing, sneezing, and personal items or utensils (cups, forks, spoons, toothbrushes). Bacterial pharyngitis may be spread from person to person by more intimate contact, such as kissing.  SIGNS AND SYMPTOMS  Symptoms of pharyngitis include:   Sore throat.   Tiredness (fatigue).   Low-grade fever.   Headache.  Joint pain and muscle aches.  Skin rashes.  Swollen lymph nodes.  Plaque-like film on throat or tonsils (often seen with bacterial pharyngitis). DIAGNOSIS  Your health care provider will ask you questions about your illness and your symptoms. Your medical history, along with a physical exam, is often all that is needed to diagnose pharyngitis. Sometimes, a rapid strep test is done. Other lab tests may also be done, depending on the suspected cause.  TREATMENT  Viral pharyngitis will usually get better in 3-4 days without the use of medicine. Bacterial pharyngitis is treated with medicines that kill germs (antibiotics).  HOME CARE INSTRUCTIONS   Drink enough water and fluids to keep your urine clear or pale yellow.   Only take over-the-counter or prescription medicines as directed by your health care provider:   If you are prescribed antibiotics, make sure you finish them even if you start to feel better.   Do not take aspirin.   Get lots of rest.   Gargle with 8 oz  of salt water ( tsp of salt per 1 qt of water) as often as every 1-2 hours to soothe your throat.   Throat lozenges (if you are not at risk for choking) or sprays may be used to soothe your throat. SEEK MEDICAL CARE IF:   You have large, tender lumps in your neck.  You have a rash.  You cough up green, yellow-brown, or bloody spit. SEEK IMMEDIATE MEDICAL CARE IF:   Your neck becomes stiff.  You drool or are unable to swallow liquids.  You vomit or are unable to keep medicines or liquids down.  You have severe pain that does not go away with the use of recommended medicines.  You have trouble breathing (not caused by a stuffy nose). MAKE SURE YOU:   Understand these instructions.  Will watch your condition.  Will get help right away if you are not doing well or get worse. Document Released: 11/21/2005 Document Revised: 09/11/2013 Document Reviewed: 07/29/2013 [facility]Exit[facility] Patient Information 2015 [place], [place]LL[place]. This information is not intended to replace advice given to you by your health care provider. Make sure you discuss any questions you have with your health care provider.

## 2015-06-15 NOTE — ED Provider Notes (Signed)
CSN: [id number]     Arrival date & time 06/15/15  1002 History  This chart was scribed for Patrick Crumbleatyana Laderius Valbuena, PA-C, working with Rolan BuccoMelanie Belfi, MD by Chestine SporeSoijett Blue, ED Scribe. The patient was seen in room TR07C/TR07C at 10:47 AM.     Chief Complaint  Patient presents with  . Sore Throat      The history is provided by the patient. No language interpreter was used.    HPI Comments: Patrick James is a 24 y.o. male who presents to the Emergency Department complaining of sore throat onset 4 days. He states that he is having associated symptoms of fever and cough. He states that he has tried ibuprofen, dayquil, and nyquil with no relief for his symptoms. He denies rhinorrhea, ear pain, neck pain, and any other symptoms. Pt is currently in school.    Past Medical History  Diagnosis Date  . Eczema   . ADHD (attention deficit hyperactivity disorder)   . Depression   . Scabies   . ADHD (attention deficit hyperactivity disorder)   . Bipolar 1 disorder   . Asthma    Past Surgical History  Procedure Laterality Date  . Eye surgery     Family History  Problem Relation Age of Onset  . Hypertension Other    History  Substance Use Topics  . Smoking status: Current Every Day Smoker    Types: Cigars  . Smokeless tobacco: Not on file  . Alcohol Use: No    Review of Systems  Constitutional: Positive for fever and fatigue.  HENT: Positive for sore throat. Negative for congestion, ear pain, rhinorrhea and trouble swallowing.   Respiratory: Positive for cough.   Musculoskeletal: Negative for neck pain.      Allergies  Review of patient's allergies indicates no known allergies.  Home Medications   Prior to Admission medications   Medication Sig Start Date End Date Taking? Authorizing Provider  acetaminophen (TYLENOL) 500 MG tablet Take 1,000 mg by mouth every 6 (six) hours as needed for mild pain.    Historical Provider, MD  ARIPiprazole (ABILIFY) 30 MG tablet Take 30 mg by  mouth at bedtime.     Historical Provider, MD  cyclobenzaprine (FLEXERIL) 10 MG tablet Take 1 tablet (10 mg total) by mouth 2 (two) times daily as needed for muscle spasms. 03/24/15   Kaitlyn Szekalski, PA-C  ibuprofen (ADVIL,MOTRIN) 200 MG tablet Take 200 mg by mouth every 6 (six) hours as needed for moderate pain.     Historical Provider, MD  lisdexamfetamine (VYVANSE) 30 MG capsule Take 30 mg by mouth daily after breakfast.    Historical Provider, MD  meloxicam (MOBIC) 15 MG tablet Take 1 tablet (15 mg total) by mouth daily. 03/24/15   Kaitlyn Szekalski, PA-C  naproxen (NAPROSYN) 500 MG tablet Take 1 tablet (500 mg total) by mouth 2 (two) times daily with a meal. 03/15/14   Cristobal GoldmannJacob Lackey, PA-C   BP 141/84 mmHg  Pulse 108  Temp(Src) 102 F (38.9 C) (Oral)  Resp 20  Ht 6\' 1"  (1.854 m)  Wt 300 lb (136.079 kg)  BMI 39.59 kg/m2  SpO2 100% Physical Exam  Constitutional: He is oriented to person, place, and time. He appears well-developed and well-nourished. No distress.  HENT:  Head: Normocephalic and atraumatic.  Right Ear: Tympanic membrane and ear canal normal.  Left Ear: Tympanic membrane and ear canal normal.  Mouth/Throat: Uvula is midline. Oropharyngeal exudate and posterior oropharyngeal erythema present.  2 ulcerations to the roof of  the mouth  Eyes: EOM are normal.  Neck: Neck supple. No tracheal deviation present.  Cardiovascular: Normal rate, regular rhythm and normal heart sounds.  Exam reveals no gallop and no friction rub.   No murmur heard. Pulmonary/Chest: Effort normal and breath sounds normal. No respiratory distress. He has no wheezes. He has no rales.  Musculoskeletal: Normal range of motion.  Neurological: He is alert and oriented to person, place, and time.  Skin: Skin is warm and dry.  Psychiatric: He has a normal mood and affect. His behavior is normal.  Nursing note and vitals reviewed.   ED Course  Procedures (including critical care time) DIAGNOSTIC  STUDIES: Oxygen Saturation is 100% on RA, nl by my interpretation.    COORDINATION OF CARE: 10:51 AM-Discussed treatment plan which includes rapid strep screen and culture, Salt water gargles, alternate tylenol and ibuprofen for fever, and abx Rx, with pt at bedside and pt agreed to plan.   Labs Review Labs Reviewed  RAPID STREP SCREEN (NOT AT [facility])  CULTURE, GROUP A STREP    Imaging Review No results found.   EKG Interpretation Patrick James      MDM   Final diagnoses:  Pharyngitis    patient with fever, sore throat, no nasal congestion, nonproductive dry cough. Here febrile 102. Tonsils are beefy, swollen, with exudate. Uvula is midline. No evidence of peritonsillar abscess. No difficulty swallowing. His rapid strep is negative however the nurse informed me that she was not able to swab the back of his throat and only got top of the tongue, based on his symptoms will treat for most likely strep pharyngitis. Will start on amoxicillin, Tylenol Motrin for pain, follow-up.  Filed Vitals:   06/15/15 1014 06/15/15 1105  BP: 141/84 130/72  Pulse: 108 89  Temp: 102 F (38.9 C) 99.9 F (37.7 C)  TempSrc: Oral Oral  Resp: 20 16  Height:  (1.854 m)   Weight: 300 lb (136.079 kg)   SpO2: 100% 99%   I personally performed the services described in this documentation, which was scribed in my presence. The recorded information has been reviewed and is accurate.   Patrick Crumble, PA-C 06/15/15 1601  Rolan Bucco, MD 06/19/15 1510

## 2015-06-15 NOTE — ED Notes (Signed)
Pt has fever, sore throat, cough x4 days

## 2015-06-17 ENCOUNTER — Emergency Department (HOSPITAL_COMMUNITY)
Admission: EM | Admit: 2015-06-17 | Discharge: 2015-06-17 | Disposition: A | Discharge status: Home / Self Care | Payer: Medicaid Other | Attending: Emergency Medicine | Admitting: Emergency Medicine

## 2015-06-17 ENCOUNTER — Encounter (HOSPITAL_COMMUNITY): Payer: Self-pay | Admitting: *Deleted

## 2015-06-17 DIAGNOSIS — Z872 Personal history of diseases of the skin and subcutaneous tissue: Secondary | ICD-10-CM | POA: Insufficient documentation

## 2015-06-17 DIAGNOSIS — Z791 Long term (current) use of non-steroidal anti-inflammatories (NSAID): Secondary | ICD-10-CM | POA: Diagnosis not present

## 2015-06-17 DIAGNOSIS — F909 Attention-deficit hyperactivity disorder, unspecified type: Secondary | ICD-10-CM | POA: Insufficient documentation

## 2015-06-17 DIAGNOSIS — Z8619 Personal history of other infectious and parasitic diseases: Secondary | ICD-10-CM | POA: Diagnosis not present

## 2015-06-17 DIAGNOSIS — J45909 Unspecified asthma, uncomplicated: Secondary | ICD-10-CM | POA: Insufficient documentation

## 2015-06-17 DIAGNOSIS — F319 Bipolar disorder, unspecified: Secondary | ICD-10-CM | POA: Insufficient documentation

## 2015-06-17 DIAGNOSIS — Z792 Long term (current) use of antibiotics: Secondary | ICD-10-CM | POA: Diagnosis not present

## 2015-06-17 DIAGNOSIS — J029 Acute pharyngitis, unspecified: Secondary | ICD-10-CM

## 2015-06-17 DIAGNOSIS — Z720 Tobacco use: Secondary | ICD-10-CM | POA: Diagnosis not present

## 2015-06-17 DIAGNOSIS — Z79899 Other long term (current) drug therapy: Secondary | ICD-10-CM | POA: Diagnosis not present

## 2015-06-17 LAB — CULTURE, GROUP A STREP: STREP A CULTURE: POSITIVE — AB

## 2015-06-17 MED ORDER — PENICILLIN G BENZATHINE 1200000 UNIT/2ML IM SUSP
1.2000 10*6.[IU] | Freq: Once | INTRAMUSCULAR | Status: AC
  Administered 2015-06-17: 1.2 10*6.[IU] via INTRAMUSCULAR
  Filled 2015-06-17: qty 2

## 2015-06-17 MED ORDER — DEXAMETHASONE SODIUM PHOSPHATE 10 MG/ML IJ SOLN
10.0000 mg | Freq: Once | INTRAMUSCULAR | Status: AC
  Administered 2015-06-17: 10 mg via INTRAMUSCULAR
  Filled 2015-06-17: qty 1

## 2015-06-17 MED ORDER — KETOROLAC TROMETHAMINE 60 MG/2ML IM SOLN
60.0000 mg | Freq: Once | INTRAMUSCULAR | Status: AC
  Administered 2015-06-17: 60 mg via INTRAMUSCULAR
  Filled 2015-06-17: qty 2

## 2015-06-17 NOTE — ED Notes (Signed)
Pt was seen here on 7/11 for sore throat and started on antibiotics. Reports increase in pain and difficulty eating. Airway intact at triage.

## 2015-06-17 NOTE — ED Provider Notes (Signed)
CSN: [id number]     Arrival date & time 06/17/15  1205 History  This chart was scribed for non-physician practitioner Joycie PeekBenjamin Dinorah Masullo, PA-C working with Raeford RazorStephen Kohut, MD by Lyndel SafeKaitlyn Sacca, ED Scribe. This patient was seen in room TR07C/TR07C and the patient's care was started at 1:29 PM.     Chief Complaint  Patient presents with  . Sore Throat   The history is provided by the patient. No language interpreter was used.    HPI Comments: Bartolo Darterdam Robert Mundorf is a 24 y.o. male who presents to the Emergency Department complaining of a gradually worsening, constant, 8/10 sore throat onset last week but worsening over the past 2 days. He associates a cough, right-sided neck pain, pain with swallowing, and a decreased PO intake. He states he has been drinking water but has been having episodes of emesis following food intake. Pt was seen in the ED 2 days ago for the complaint of sore throat, fever, cough, and nasal congestion when he was diagnosed with pharyngitis and started on an amoxicillin course, which he has been compliant with, and prescribed ibuprofen. His rapid strep test was negative on this visit. He has been gargling with warm salt water but states he ran out of salt yesterday. Denies voice change, fevers, dyspnea, or SOB.   Past Medical History  Diagnosis Date  . Eczema   . ADHD (attention deficit hyperactivity disorder)   . Depression   . Scabies   . ADHD (attention deficit hyperactivity disorder)   . Bipolar 1 disorder   . Asthma    Past Surgical History  Procedure Laterality Date  . Eye surgery     Family History  Problem Relation Age of Onset  . Hypertension Other    History  Substance Use Topics  . Smoking status: Current Every Day Smoker    Types: Cigars  . Smokeless tobacco: Not on file  . Alcohol Use: No    Review of Systems  Constitutional: Negative for fever.  HENT: Positive for sore throat. Negative for voice change.   Respiratory: Positive for cough.  Negative for shortness of breath.   Gastrointestinal: Positive for vomiting. Negative for nausea.  Musculoskeletal: Positive for neck pain.  All other systems reviewed and are negative.  Allergies  Review of patient's allergies indicates no known allergies.  Home Medications   Prior to Admission medications   Medication Sig Start Date End Date Taking? Authorizing Provider  acetaminophen (TYLENOL) 500 MG tablet Take 1,000 mg by mouth every 6 (six) hours as needed for mild pain.    Historical Provider, MD  amoxicillin (AMOXIL) 500 MG capsule Take 1 capsule (500 mg total) by mouth 3 (three) times daily. 06/15/15   Tatyana Kirichenko, PA-C  ARIPiprazole (ABILIFY) 30 MG tablet Take 30 mg by mouth at bedtime.     Historical Provider, MD  cyclobenzaprine (FLEXERIL) 10 MG tablet Take 1 tablet (10 mg total) by mouth 2 (two) times daily as needed for muscle spasms. 03/24/15   Kaitlyn Szekalski, PA-C  ibuprofen (ADVIL,MOTRIN) 200 MG tablet Take 200 mg by mouth every 6 (six) hours as needed for moderate pain.     Historical Provider, MD  ibuprofen (ADVIL,MOTRIN) 800 MG tablet Take 1 tablet (800 mg total) by mouth 3 (three) times daily. 06/15/15   Tatyana Kirichenko, PA-C  lisdexamfetamine (VYVANSE) 30 MG capsule Take 30 mg by mouth daily after breakfast.    Historical Provider, MD  meloxicam (MOBIC) 15 MG tablet Take 1 tablet (15 mg total) by  mouth daily. 03/24/15   Kaitlyn Szekalski, PA-C  naproxen (NAPROSYN) 500 MG tablet Take 1 tablet (500 mg total) by mouth 2 (two) times daily with a meal. 03/15/14   Cristobal Goldmann, PA-C   BP 144/83 mmHg  Pulse 92  Temp(Src) 98.3 F (36.8 C) (Oral)  Resp 18  Ht  (1.854 m)  Wt 323 lb 3.2 oz (146.603 kg)  BMI 42.65 kg/m2  SpO2 100% Physical Exam  Constitutional: He appears well-developed and well-nourished.  HENT:  Head: Normocephalic and atraumatic.  Tonsillar exudates on the right tonsil, mild erythema. No unilateral tonsillar swelling. Uvula is midline. No  trismus. No glossal elevation. Patent airway and tolerating secretions well.  Eyes: Conjunctivae are normal. Right eye exhibits no discharge. Left eye exhibits no discharge.  Neck:  Right-sided mild cervical adenopathy.  Cardiovascular: Normal rate, regular rhythm and normal heart sounds.   Pulmonary/Chest: Effort normal and breath sounds normal. No respiratory distress.  Clear to auscultation bilaterally.   Lymphadenopathy:    He has cervical adenopathy.  Neurological: He is alert. Coordination normal.  Skin: Skin is warm and dry. No rash noted. He is not diaphoretic. No erythema.  Psychiatric: He has a normal mood and affect.  Nursing note and vitals reviewed.  ED Course  Procedures  DIAGNOSTIC STUDIES: Oxygen Saturation is 100% on RA, normal by my interpretation.    COORDINATION OF CARE: 1:33 PM Discussed treatment plan with pt. Toradol and Decadron injections ordered. Pt acknowledges and agrees to plan.   Labs Review Labs Reviewed - No data to display  Imaging Review No results found.   EKG Interpretation None     Meds given in ED:  Medications  ketorolac (TORADOL) injection 60 mg (60 mg Intramuscular Given 06/17/15 1348)  dexamethasone (DECADRON) injection 10 mg (10 mg Intramuscular Given 06/17/15 1347)  penicillin g benzathine (BICILLIN LA) 1200000 UNIT/2ML injection 1.2 Million Units (1.2 Million Units Intramuscular Given 06/17/15 1449)    Discharge Medication List as of 06/17/2015  2:41 PM      MDM  Vitals stable - WNL -afebrile Pt resting comfortably in ED. PE--physical exam is consistent with a viral pharyngitis/tonsillitis. No evidence of retropharyngeal or peritonsillar abscesses. Low concern for Ludwig angina.  Treated in the ED with Toradol, Decadron as well as Bicillin shot. Advised DC oral antibiotic at home. Encouraged follow-up with primary care for further management of symptoms. Strict return precautions given. I discussed all relevant lab findings  and imaging results with pt and they verbalized understanding. Discussed f/u with PCP within 48 hrs and return precautions, pt very amenable to plan.  Final diagnoses:  Pharyngitis  I personally performed the services described in this documentation, which was scribed in my presence. The recorded information has been reviewed and is accurate.    Joycie Peek, PA-C 06/17/15 1525  Raeford Razor, MD 06/19/15 1440

## 2015-06-17 NOTE — Discharge Instructions (Signed)
You were evaluated in the ED today for your sore throat. You were given a shot of anti-inflammatory, steroid as well as amoxicillin. You do not need to continue to take your oral antibiotic. Please follow-up with primary care for further evaluation and management of your symptoms. Return to ED for new or worsening symptoms.

## 2015-06-19 ENCOUNTER — Telehealth: Payer: Self-pay | Admitting: *Deleted

## 2015-06-19 NOTE — ED Notes (Signed)
(+)  strep culture, treated with Amoxicillin, OK per J. Farrel GobbleFrens, Pharm

## 2015-06-20 ENCOUNTER — Encounter (HOSPITAL_COMMUNITY): Payer: Self-pay | Admitting: Emergency Medicine

## 2015-06-20 ENCOUNTER — Emergency Department (HOSPITAL_COMMUNITY)
Admission: EM | Admit: 2015-06-20 | Discharge: 2015-06-20 | Disposition: A | Discharge status: Home / Self Care | Payer: Medicaid Other | Attending: Emergency Medicine | Admitting: Emergency Medicine

## 2015-06-20 ENCOUNTER — Emergency Department (HOSPITAL_COMMUNITY): Payer: Medicaid Other

## 2015-06-20 DIAGNOSIS — Z720 Tobacco use: Secondary | ICD-10-CM | POA: Diagnosis not present

## 2015-06-20 DIAGNOSIS — Z872 Personal history of diseases of the skin and subcutaneous tissue: Secondary | ICD-10-CM | POA: Insufficient documentation

## 2015-06-20 DIAGNOSIS — R112 Nausea with vomiting, unspecified: Secondary | ICD-10-CM | POA: Diagnosis present

## 2015-06-20 DIAGNOSIS — M791 Myalgia: Secondary | ICD-10-CM | POA: Insufficient documentation

## 2015-06-20 DIAGNOSIS — F319 Bipolar disorder, unspecified: Secondary | ICD-10-CM | POA: Insufficient documentation

## 2015-06-20 DIAGNOSIS — J45909 Unspecified asthma, uncomplicated: Secondary | ICD-10-CM | POA: Insufficient documentation

## 2015-06-20 DIAGNOSIS — J029 Acute pharyngitis, unspecified: Secondary | ICD-10-CM | POA: Diagnosis not present

## 2015-06-20 DIAGNOSIS — Z792 Long term (current) use of antibiotics: Secondary | ICD-10-CM | POA: Diagnosis not present

## 2015-06-20 DIAGNOSIS — Z79899 Other long term (current) drug therapy: Secondary | ICD-10-CM | POA: Diagnosis not present

## 2015-06-20 DIAGNOSIS — R05 Cough: Secondary | ICD-10-CM

## 2015-06-20 DIAGNOSIS — R058 Other specified cough: Secondary | ICD-10-CM

## 2015-06-20 LAB — CBC
HCT: 43.4 % (ref 39.0–52.0)
Hemoglobin: 14.9 g/dL (ref 13.0–17.0)
MCH: 29.9 pg (ref 26.0–34.0)
MCHC: 34.3 g/dL (ref 30.0–36.0)
MCV: 87.1 fL (ref 78.0–100.0)
Platelets: 180 10*3/uL (ref 150–400)
RBC: 4.98 MIL/uL (ref 4.22–5.81)
RDW: 12.2 % (ref 11.5–15.5)
WBC: 8.4 10*3/uL (ref 4.0–10.5)

## 2015-06-20 LAB — COMPREHENSIVE METABOLIC PANEL
ALT: 29 U/L (ref 17–63)
ANION GAP: 10 (ref 5–15)
AST: 23 U/L (ref 15–41)
Albumin: 3.9 g/dL (ref 3.5–5.0)
Alkaline Phosphatase: 52 U/L (ref 38–126)
BILIRUBIN TOTAL: 0.8 mg/dL (ref 0.3–1.2)
BUN: 12 mg/dL (ref 6–20)
CHLORIDE: 103 mmol/L (ref 101–111)
CO2: 25 mmol/L (ref 22–32)
CREATININE: 1.04 mg/dL (ref 0.61–1.24)
Calcium: 9.1 mg/dL (ref 8.9–10.3)
GFR calc non Af Amer: >60 mL/min (ref >60)
Glucose, Bld: 108 mg/dL — ABNORMAL HIGH (ref 65–99)
Potassium: 3.4 mmol/L — ABNORMAL LOW (ref 3.5–5.1)
Sodium: 138 mmol/L (ref 135–145)
Total Protein: 7.4 g/dL (ref 6.5–8.1)

## 2015-06-20 LAB — URINALYSIS, DIPSTICK ONLY
Bilirubin Urine: NEGATIVE
Glucose, UA: NEGATIVE mg/dL
HGB URINE DIPSTICK: NEGATIVE
Ketones, ur: 40 mg/dL — AB
Leukocytes, UA: NEGATIVE
Nitrite: NEGATIVE
Protein, ur: NEGATIVE mg/dL
Specific Gravity, Urine: 1.023 (ref 1.005–1.030)
Urobilinogen, UA: 1 mg/dL (ref 0.0–1.0)
pH: 5.5 (ref 5.0–8.0)

## 2015-06-20 LAB — LIPASE, BLOOD: LIPASE: 21 U/L — AB (ref 22–51)

## 2015-06-20 MED ORDER — ONDANSETRON 4 MG PO TBDP
ORAL_TABLET | ORAL | Status: AC
  Filled 2015-06-20: qty 2

## 2015-06-20 MED ORDER — ONDANSETRON HCL 4 MG/2ML IJ SOLN
4.0000 mg | Freq: Once | INTRAMUSCULAR | Status: DC
  Filled 2015-06-20: qty 2

## 2015-06-20 MED ORDER — SODIUM CHLORIDE 0.9 % IV BOLUS (SEPSIS): 1000.0000 mL | Freq: Once | INTRAVENOUS | Status: DC

## 2015-06-20 MED ORDER — AEROCHAMBER PLUS W/MASK MISC
1.0000 | Freq: Once | Status: AC
  Administered 2015-06-20: 1
  Filled 2015-06-20 (×2): qty 1

## 2015-06-20 MED ORDER — LIDOCAINE VISCOUS 2 % MT SOLN
15.0000 mL | Freq: Once | OROMUCOSAL | Status: AC
  Administered 2015-06-20: 15 mL via OROMUCOSAL
  Filled 2015-06-20: qty 15

## 2015-06-20 MED ORDER — ALBUTEROL SULFATE HFA 108 (90 BASE) MCG/ACT IN AERS
4.0000 | INHALATION_SPRAY | Freq: Once | RESPIRATORY_TRACT | Status: AC
  Administered 2015-06-20: 4 via RESPIRATORY_TRACT
  Filled 2015-06-20: qty 6.7

## 2015-06-20 MED ORDER — ONDANSETRON 4 MG PO TBDP
4.0000 mg | ORAL_TABLET | Freq: Once | ORAL | Status: AC | PRN
  Administered 2015-06-20: 4 mg via ORAL

## 2015-06-20 MED ORDER — ONDANSETRON 4 MG PO TBDP: 4.0000 mg | ORAL_TABLET | Freq: Once | ORAL | Status: DC | PRN

## 2015-06-20 NOTE — ED Notes (Signed)
Pt. reports nausea and vomitting onset this week with occasional diarrhea , denies fever or chills.

## 2015-06-20 NOTE — ED Provider Notes (Signed)
Complains of sore throat with cough for approximately 2 weeks. He had 2 or 3 episodes of vomiting today. He feels improved since treatment with albuterol inhaler. On exam no distress HEENT exam oral pharynx reddened uvula midline. Neck supple lungs clear auscultation heart regular rate and rhythm abdomen obese ,  chest x-ray viewed by me Results for orders placed or performed during the hospital encounter of 06/20/15  Lipase, blood  Result Value Ref Range   Lipase 21 (L) 22 - 51 U/L  Comprehensive metabolic panel  Result Value Ref Range   Sodium 138 135 - 145 mmol/L   Potassium 3.4 (L) 3.5 - 5.1 mmol/L   Chloride 103 101 - 111 mmol/L   CO2 25 22 - 32 mmol/L   Glucose, Bld 108 (H) 65 - 99 mg/dL   BUN 12 6 - 20 mg/dL   Creatinine, Ser 1.611.04 0.61 - 1.24 mg/dL   Calcium 9.1 8.9 - 09.610.3 mg/dL   Total Protein 7.4 6.5 - 8.1 g/dL   Albumin 3.9 3.5 - 5.0 g/dL   AST 23 15 - 41 U/L   ALT 29 17 - 63 U/L   Alkaline Phosphatase 52 38 - 126 U/L   Total Bilirubin 0.8 0.3 - 1.2 mg/dL   GFR calc non Af Amer >60 >60 mL/min   GFR calc Af Amer >60 >60 mL/min   Anion gap 10 5 - 15  CBC  Result Value Ref Range   WBC 8.4 4.0 - 10.5 K/uL   RBC 4.98 4.22 - 5.81 MIL/uL   Hemoglobin 14.9 13.0 - 17.0 g/dL   HCT 04.543.4 40.939.0 - 81.152.0 %   MCV 87.1 78.0 - 100.0 fL   MCH 29.9 26.0 - 34.0 pg   MCHC 34.3 30.0 - 36.0 g/dL   RDW 91.412.2 78.211.5 - 95.615.5 %   Platelets 180 150 - 400 K/uL  Urinalysis, dipstick only  Result Value Ref Range   Specific Gravity, Urine 1.023 1.005 - 1.030   pH 5.5 5.0 - 8.0   Glucose, UA NEGATIVE NEGATIVE mg/dL   Hgb urine dipstick NEGATIVE NEGATIVE   Bilirubin Urine NEGATIVE NEGATIVE   Ketones, ur 40 (A) NEGATIVE mg/dL   Protein, ur NEGATIVE NEGATIVE mg/dL   Urobilinogen, UA 1.0 0.0 - 1.0 mg/dL   Nitrite NEGATIVE NEGATIVE   Leukocytes, UA NEGATIVE NEGATIVE   Dg Chest 2 View  06/20/2015   CLINICAL DATA:  Shortness of breath and cough  EXAM: CHEST - 2 VIEW  COMPARISON:  01/06/2011  FINDINGS:  The heart size and mediastinal contours are within normal limits. Both lungs are clear. The visualized skeletal structures are unremarkable.  IMPRESSION: No active disease.   Electronically Signed   By: Alcide CleverMark  Lukens M.D.   On: 06/20/2015 22:06   Illness likely viral in etiology  Doug SouSam Leni Pankonin, MD 06/20/15 2349

## 2015-06-20 NOTE — ED Notes (Signed)
IV attempted x 4 by multiple RNs without success; MD made aware

## 2015-06-20 NOTE — ED Provider Notes (Signed)
CSN: [id number]     Arrival date & time 06/20/15  2039 History   First MD Initiated Contact with Patient 06/20/15 2129     Chief Complaint  Patient presents with  . Emesis    (Consider location/radiation/quality/duration/timing/severity/associated sxs/prior Treatment) Patient is a 24 y.o. male presenting with pharyngitis. The history is provided by the patient. No language interpreter was used.  Sore Throat This is a new problem. The current episode started 1 to 4 weeks ago. The problem occurs constantly. The problem has been unchanged. Associated symptoms include coughing, myalgias, nausea, a sore throat and vomiting. Pertinent negatives include no abdominal pain, arthralgias, chest pain, chills, congestion, fatigue, fever, headaches, rash, urinary symptoms or weakness. Nothing aggravates the symptoms. Treatments tried: Abx, NSAIDs. The treatment provided no relief.    Past Medical History  Diagnosis Date  . Eczema   . ADHD (attention deficit hyperactivity disorder)   . Depression   . Scabies   . ADHD (attention deficit hyperactivity disorder)   . Bipolar 1 disorder   . Asthma    Past Surgical History  Procedure Laterality Date  . Eye surgery     Family History  Problem Relation Age of Onset  . Hypertension Other    History  Substance Use Topics  . Smoking status: Current Every Day Smoker    Types: Cigars  . Smokeless tobacco: Not on file  . Alcohol Use: No    Review of Systems  Constitutional: Negative for fever, chills and fatigue.  HENT: Positive for sore throat. Negative for congestion, trouble swallowing and voice change.   Respiratory: Positive for cough and chest tightness. Negative for shortness of breath and wheezing.   Cardiovascular: Negative for chest pain and palpitations.  Gastrointestinal: Positive for nausea and vomiting. Negative for abdominal pain, diarrhea and constipation.  Musculoskeletal: Positive for myalgias. Negative for arthralgias.  Skin:  Negative for rash.  Neurological: Negative for weakness, light-headedness and headaches.  Psychiatric/Behavioral: Negative for confusion.  All other systems reviewed and are negative.   Allergies  Review of patient's allergies indicates no known allergies.  Home Medications   Prior to Admission medications   Medication Sig Start Date End Date Taking? Authorizing Provider  amoxicillin (AMOXIL) 500 MG capsule Take 1 capsule (500 mg total) by mouth 3 (three) times daily. 06/15/15  Yes Tatyana Kirichenko, PA-C  ARIPiprazole (ABILIFY) 30 MG tablet Take 30 mg by mouth at bedtime.    Yes Historical Provider, MD  traZODone (DESYREL) 50 MG tablet Take 50 mg by mouth at bedtime. 04/11/15  Yes Historical Provider, MD  VYVANSE 40 MG capsule Take 40 mg by mouth every morning. 06/17/15  Yes Historical Provider, MD  cyclobenzaprine (FLEXERIL) 10 MG tablet Take 1 tablet (10 mg total) by mouth 2 (two) times daily as needed for muscle spasms. 03/24/15   Kaitlyn Szekalski, PA-C  meloxicam (MOBIC) 15 MG tablet Take 1 tablet (15 mg total) by mouth daily. 03/24/15   Kaitlyn Szekalski, PA-C  naproxen (NAPROSYN) 500 MG tablet Take 1 tablet (500 mg total) by mouth 2 (two) times daily with a meal. 03/15/14   Cristobal Goldmann, PA-C    BP 150/75 mmHg  Pulse 93  Temp(Src) 98.9 F (37.2 C) (Oral)  Resp 24  SpO2 96%   Physical Exam  Constitutional: He is oriented to person, place, and time. He appears well-developed and well-nourished. No distress.  HENT:  Head: Normocephalic and atraumatic.  Nose: Nose normal.  Mouth/Throat: Oropharyngeal exudate present.  Bilateral tonsillar hypertrophy and exudate.  Normal phonation, no cervical lymphadenopathy.  Full neck ROM, with no meningeal signs   Eyes: EOM are normal. Pupils are equal, round, and reactive to light.  Neck: Normal range of motion. Neck supple.  Cardiovascular: Normal rate, regular rhythm, normal heart sounds and intact distal pulses.   No murmur  heard. Pulmonary/Chest: Effort normal and breath sounds normal. No respiratory distress. He has no wheezes. He exhibits tenderness.  Mild reproducible chest wall tenderness at right lateral chest, no increased work of breathing, no wheezing or rhonchi.  Mild recurrent dry cough   Abdominal: Soft. He exhibits no distension. There is no tenderness. There is no guarding.  Musculoskeletal: Normal range of motion. He exhibits no tenderness.  Neurological: He is alert and oriented to person, place, and time. No cranial nerve deficit. Coordination normal.  Skin: Skin is warm and dry. He is not diaphoretic. No pallor.  Psychiatric: He has a normal mood and affect. His behavior is normal. Judgment and thought content normal.  Nursing note and vitals reviewed.   ED Course  Procedures (including critical care time) Labs Review Labs Reviewed  LIPASE, BLOOD - Abnormal; Notable for the following:    Lipase 21 (*)    All other components within normal limits  COMPREHENSIVE METABOLIC PANEL - Abnormal; Notable for the following:    Potassium 3.4 (*)    Glucose, Bld 108 (*)    All other components within normal limits  CBC  URINALYSIS, DIPSTICK ONLY    Imaging Review Dg Chest 2 View  06/20/2015   CLINICAL DATA:  Shortness of breath and cough  EXAM: CHEST - 2 VIEW  COMPARISON:  01/06/2011  FINDINGS: The heart size and mediastinal contours are within normal limits. Both lungs are clear. The visualized skeletal structures are unremarkable.  IMPRESSION: No active disease.   Electronically Signed   By: Alcide CleverMark  Lukens M.D.   On: 06/20/2015 22:06     EKG Interpretation None      MDM   Final diagnoses:  Sore throat  Dry cough  Non-intractable vomiting with nausea, vomiting of unspecified type   Pt is a 24 yo M with hx of ADHD and bipolar disorder who presents with 3 weeks of intermittent dry cough, several days of sore throat, and several days of N/V.  Seen 7/11 and was diagnosed with pharyngitis and  given amoxil and motrin. Rapid strep negative that day.  Then returned to the ED on 7/13 for similar sx and was given toradol, decadrol, and bicillin. Reports his sore throat has not improved, he still has a recurrent dry cough, and now has chest tightness with the cough and N/V.  Several episodes of NBNB emesis today, which caused his sore throat to be more uncomfortable.  Has been afebrile throughout.  Denies wheezing.  No diarrhea or dysuria, no rash noted.   Tonsillar exudates bilaterally but very patent airway, able to phonate normally, tolerating PO intake, and full neck ROM.  Mild right lateral chest wall tenderness to palpation.  Clear lungs.  Dry cough.    Given zofran for nausea.  Treated with albuterol for bronchospasmic cough and viscous lidocaine for sore throat.  Has already been treated and was strep negative, so do not believe he needs additional testing or treatment today.   Tolerating PO intake and requesting snacks.   Considered appropriate for dc home.  Given education on albuterol/spacer use and encouraged to take 4 puffs q 4-6 hrs PRN for recurrent episodes of coughing over the next 2-3  days.  Advised to taper dosing over the next few days, and discussed side effects of beta agonists.   Encouraged OTC pain meds of tylenol and motrin.  Advised cold liquids to soothe his throat.  Given Rx for zofran.  Discussed ED return precautions and pt was discharged in good condition.   Labs and imaging were reviewed and interpreted by myself and my attending, and incorporated in the medical decision making.  Patient was seen with ED Attending, Dr. Idamae Schuller, MD   Lenell Antu, MD 06/21/15 1549  Doug Sou, MD 06/22/15 1610

## 2015-06-20 NOTE — ED Notes (Signed)
Pt to Xray.

## 2016-01-20 ENCOUNTER — Emergency Department (HOSPITAL_COMMUNITY)
Admission: EM | Admit: 2016-01-20 | Discharge: 2016-01-20 | Disposition: A | Discharge status: Home / Self Care | Payer: Medicaid Other | Attending: Emergency Medicine | Admitting: Emergency Medicine

## 2016-01-20 ENCOUNTER — Encounter (HOSPITAL_COMMUNITY): Payer: Self-pay | Admitting: Emergency Medicine

## 2016-01-20 DIAGNOSIS — F17210 Nicotine dependence, cigarettes, uncomplicated: Secondary | ICD-10-CM | POA: Insufficient documentation

## 2016-01-20 DIAGNOSIS — Z79899 Other long term (current) drug therapy: Secondary | ICD-10-CM | POA: Diagnosis not present

## 2016-01-20 DIAGNOSIS — Z760 Encounter for issue of repeat prescription: Secondary | ICD-10-CM | POA: Insufficient documentation

## 2016-01-20 DIAGNOSIS — Z872 Personal history of diseases of the skin and subcutaneous tissue: Secondary | ICD-10-CM | POA: Diagnosis not present

## 2016-01-20 DIAGNOSIS — F319 Bipolar disorder, unspecified: Secondary | ICD-10-CM | POA: Diagnosis not present

## 2016-01-20 DIAGNOSIS — F909 Attention-deficit hyperactivity disorder, unspecified type: Secondary | ICD-10-CM | POA: Insufficient documentation

## 2016-01-20 DIAGNOSIS — Z8619 Personal history of other infectious and parasitic diseases: Secondary | ICD-10-CM | POA: Insufficient documentation

## 2016-01-20 DIAGNOSIS — J45909 Unspecified asthma, uncomplicated: Secondary | ICD-10-CM | POA: Diagnosis not present

## 2016-01-20 NOTE — ED Notes (Signed)
Patient reports he had an appointment with PCP Monday, PCP was out of town and he would like a refill on his Vyvanse. Denies other c/c.

## 2016-01-20 NOTE — ED Provider Notes (Signed)
CSN: [id number]     Arrival date & time 01/20/16  1659 History  By signing my name below, I, Phillis Haggis, attest that this documentation has been prepared under the direction and in the presence of [employer], PA-C. Electronically Signed: Phillis Haggis, ED Scribe. 01/20/2016. 8:46 PM.   Chief Complaint  Patient presents with  . Medication Refill   The history is provided by the patient. No language interpreter was used.  HPI Comments: Patrick James is a 25 y.o. Male with a hx of ADHD, depression, ADHD, and Bipolar 1 Disorder who presents to the Emergency Department requesting a medication refill. Pt has been out of his Vyvanse medication for almost a week. He has contacted his PCP for an appointment and was directed to come to the ED to receive a breakthrough dosage because his PCP is out of town. Pt has been previously treated at [facility]. Pt denies fever, chills, nausea, vomiting, suicidal ideation, homicidal ideation, auditory or visual hallucinations, alcohol or drug abuse.  Past Medical History  Diagnosis Date  . Eczema   . ADHD (attention deficit hyperactivity disorder)   . Depression   . Scabies   . ADHD (attention deficit hyperactivity disorder)   . Bipolar 1 disorder (HCC)   . Asthma    Past Surgical History  Procedure Laterality Date  . Eye surgery     Family History  Problem Relation Age of Onset  . Hypertension Other    Social History  Substance Use Topics  . Smoking status: Current Every Day Smoker    Types: Cigars  . Smokeless tobacco: None  . Alcohol Use: No    Review of Systems  Constitutional: Negative for fever and chills.  Gastrointestinal: Negative for nausea and vomiting.  All other systems reviewed and are negative.  Allergies  Review of patient's allergies indicates no known allergies.  Home Medications   Prior to Admission medications   Medication Sig Start Date End Date Taking? Authorizing Provider  Ascorbic Acid (VITAMIN C)  1000 MG tablet Take 1,000 mg by mouth daily.   Yes Historical Provider, MD  cholecalciferol (VITAMIN D) 1000 units tablet Take 1,000 Units by mouth daily.   Yes Historical Provider, MD  PARoxetine (PAXIL) 20 MG tablet Take 20 mg by mouth daily.   Yes Historical Provider, MD  traZODone (DESYREL) 50 MG tablet Take 50 mg by mouth at bedtime. 04/11/15  Yes Historical Provider, MD  VYVANSE 40 MG capsule Take 40 mg by mouth every morning. 06/17/15  Yes Historical Provider, MD  amoxicillin (AMOXIL) 500 MG capsule Take 1 capsule (500 mg total) by mouth 3 (three) times daily. Patient not taking: Reported on 01/20/2016 06/15/15   Tatyana Kirichenko, PA-C  cyclobenzaprine (FLEXERIL) 10 MG tablet Take 1 tablet (10 mg total) by mouth 2 (two) times daily as needed for muscle spasms. Patient not taking: Reported on 01/20/2016 03/24/15   Emilia Beck, PA-C  meloxicam (MOBIC) 15 MG tablet Take 1 tablet (15 mg total) by mouth daily. Patient not taking: Reported on 01/20/2016 03/24/15   Emilia Beck, PA-C  naproxen (NAPROSYN) 500 MG tablet Take 1 tablet (500 mg total) by mouth 2 (two) times daily with a meal. Patient not taking: Reported on 01/20/2016 03/15/14   Cristobal Goldmann, PA-C  ondansetron (ZOFRAN-ODT) 4 MG disintegrating tablet Take 1 tablet (4 mg total) by mouth once as needed for nausea. Patient not taking: Reported on 01/20/2016 06/20/15   Lenell Antu, MD   BP 121/43 mmHg  Pulse 71  Temp(Src) 97.5 F (36.4 C) (Oral)  Resp 18  SpO2 99% Physical Exam  Constitutional: He is oriented to person, place, and time. He appears well-developed and well-nourished. No distress.  HENT:  Head: Normocephalic and atraumatic.  Mouth/Throat: Oropharynx is clear and moist. No oropharyngeal exudate.  Eyes: Conjunctivae and EOM are normal. Pupils are equal, round, and reactive to light.  Neck: Normal range of motion. Neck supple.  Cardiovascular: Normal rate, regular rhythm and intact distal pulses.   Pulmonary/Chest:  Effort normal and breath sounds normal.  Abdominal: Soft. There is no tenderness.  Musculoskeletal: Normal range of motion.  Neurological: He is alert and oriented to person, place, and time.  Skin: Skin is warm and dry. He is not diaphoretic.  Psychiatric: He has a normal mood and affect. His behavior is normal.  Nursing note and vitals reviewed.   ED Course  Procedures (including critical care time) DIAGNOSTIC STUDIES: Oxygen Saturation is 100% on RA, normal by my interpretation.    COORDINATION OF CARE: 8:45 PM-Discussed treatment plan which includes resource guide with pt at bedside and pt agreed to plan.    Labs Review Labs Reviewed - No data to display  Imaging Review No results found. I have personally reviewed and evaluated these images and lab results as part of my medical decision-making.   EKG Interpretation None      MDM   Final diagnoses:  Medication refill    Filed Vitals:   01/20/16 1725 01/20/16 2028  BP: 127/78 121/43  Pulse: 88 71  Temp: 98.2 F (36.8 C) 97.5 F (36.4 C)  TempSrc: Oral Oral  Resp: 20 18  SpO2: 96% 99%    Patrick James is 25 y.o. male presenting with request for refill on his Vyvanse. He's been out for a week. Apparently his primary care physician as I believe unexpectedly and he was advised by the office staff to present to the ED. Advised patient we do not refill controlled medications out of the ED and patient is given a psychiatric referral guide.   Evaluation does not show pathology that would require ongoing emergent intervention or inpatient treatment. Pt is hemodynamically stable and mentating appropriately. Discussed findings and plan with patient/guardian, who agrees with care plan. All questions answered. Return precautions discussed and outpatient follow up given.    I personally performed the services described in this documentation, which was scribed in my presence. The recorded information has been reviewed  and is accurate.    Wynetta Emery, PA-C 01/21/16 0004  Azalia Bilis, MD 01/21/16 [phone]

## 2016-01-20 NOTE — Discharge Instructions (Signed)
Community Resource Guide Outpatient Counseling/Substance Abuse Adult °The [employer]’s “211” is a great source of information about community services available.  Access by dialing 2-1-1 from anywhere in [place], or by website -  www.nc211.org.  ° °Other Local Resources (Updated 12/2015) ° °Crisis Hotlines °  °Services  ° °  °Area Served  °[facility] • Crisis Hotline, available 24 hours a day, 7 days a week: 800-939-5911 [place], [place]  ° [facility] • Crisis Hotline, available 24 hours a day, 7 days a week: 866-275-9552 [place], [place]  °[facility] • Suicide Prevention Hotline, available 24 hours a day, 7 days a week: 800-273-8255 [place], [place]  °[facility] ° • Crisis Hotline, available 24 hours a day, 7 days a week: 336-676-6840 [place], [place] °  °[facility] • Crisis Hotline, available 24 hours a day, 7 days a week: 800-256-2452 All °  °[facility] • Crisis Hotline, available 24 hours a day, 7 days a week: 877-626-1772 [place]  ° °Other Local Resources (Updated 12/2015) ° °Outpatient Counseling/ Substance Abuse Programs  °Services  ° °  °Address and Phone Number  °[facility] ° • Options include Individual counseling, group counseling, intensive outpatient program (several hours a day, several days a week) °• Offers depression assessments °• Provides methadone maintenance program 336-333-6860 °[address], [address] °[place], [place] [address] °  °[facility] ° • Offers partial hospitalization/day treatment and DUI/DWI programs °• Accepts Medicare, private insurance 336-299-4655 °[address], [address] °[place], [place] [address]  °[facility] ° ° • Services include intensive outpatient program (several hours a day, several days a week), outpatient treatment, DUI/DWI services, family education °• Also has some services specifically for Veterans °• Offers transitional housing   336-886-5594 °[address] °[place], [place] [address] °  °  °[facility] • Accepts Medicare, private pay, and private insurance 336-272-0855 °[address] °[place], [place] [address]  °[facility] • Services include individual counseling, substance abuse intensive outpatient program (several hours a day, several days a week), day treatment °• Accepts Medicare, Medicaid, private insurance 336-271-5888 °[address] °[place], [place] [address]  °[facility] ° • Offers substance abuse intensive outpatient program (several hours a day, several days a week), partial hospitalization program 336-832-9800 °[address] °[place], [place] [address] ° °336-349-4454 °[address] °[place], [place] [address] ° °336-386-3795 °[address] °[place], [place] [address] ° °336-993-6120 °[address] °[place], [place] [address]  °[facility] • Individual counseling only °• Accepts private insurance only 336-292-1510 °[address] °[place], [place] [address]  °[facility] • Methadone maintenance program 800-805-6989 °[address] °[place], [place] [address]  °[facility] • Walk-In Clinic providing substance abuse and mental health counseling °• Accepts Medicaid, Medicare, private insurance °• Offers sliding scale for uninsured 336-342-8316 °[address] °[place], [place]   °[facility] • Offers individual counseling, and intensive in-home services 336-347-7415 °[address] °[place], [place] [address]  °[facility] • Offers individual counseling, family counseling, group therapy, domestic violence counseling, consumer credit counseling °• Accepts Medicare, Medicaid, private insurance °• Offers sliding scale for uninsured 336-387-6161 °[address] °,  [address] ° °336-889-6161 °[place], [address] °[place],  [address]  °[facility] • Offers individual, family   and group counseling °• 3 locations - [place], [place], and [place] ° 336-899-8800 ° °[address] °[place], [place] [address] ° °[address] °[place], [place] [address] ° °[address] °[place], [place] [address]  °[facility]  ° • Offers psychiatric assessment, 8-week Intensive Outpatient Program (several hours a day, several times a week, daytime or evenings), early recovery group, family Program, medication management °• Private pay or private insurance only 336 -621-3381, or  °800-659-3381 °[address] °[place], [place] [address]  °[facility] • Offers individual, couples and family counseling °• Accepts Medicaid, private insurance, and sliding scale for uninsured 336-542-2076 °[address] °[place], [place] [address]  °David Fuller, MD • Individual counseling °• Private insurance 336-852-4051 °[address] °[place], [place] [address]  °[facility] ° • Offers assessment, substance abuse treatment, and behavioral health treatment 336-878-6098 °[address] °[place], [place] [address]  °[facility] • Individual counseling °• Accepts private insurance 336-272-1972 °[address] °[place], [place] [address]  °[facility] • Individual counseling °• Accepts Medicare, private insurance 336-547-1574 °[address] °[place], [place] [address]  °[facility]  ° • Offers intensive outpatient program (several hours a day, several times a week) °• Private pay, private insurance [phone] °[address] °[place], [place]  °[facility] • Individual counseling °• Medicare, private insurance 336-505-9494 °[address] °[place], [place] [address]  °[facility]  ° • Offers intensive outpatient program (several hours a day, several times a week) and partial hospitalization  program 336-794-3550 °[address] °[place], [place] [address]  °Parrish McKinney, MD • Individual counseling 336-282-1251 °[address] °[place], [place] [address]  °[facility] • Offers Christian counseling to individuals, couples, and families °• Accepts Medicare and private insurance; offers sliding scale for uninsured 336-288-1484 °[address] °[place], [place] [address]  °[facility] • Christian counseling 336-542-2060 °[address] °[place], [place] [address]  °[facility] ° • Offers crisis counseling, individual counseling, group therapy, in-home therapy, domestic violence services, day treatment, DWI services, Community Support Team (CST), Assertive Community Treatment Team (ACTT), substance abuse Intensive Outpatient Program (several hours a day, several times a week) °• 2 locations - [place] and [place] 336-229-5905 °[address] °[place], [place] [address] ° °336-694-1777 °[address] °[place], [place] [address]  °[facility]  ° ° • Individual counseling and group therapy °• Accepts private insurance, Medicare, Medicaid 336-379-7146 °[address] °[place], [place]  °[facility] • Offers individual and family counseling °• Offers LGBTQ services °• Accepts private insurance and private pay 336-288-9190 °[address] °[place], [place] [address]  °[facility]  ° • Offers individual counseling, group therapy, and outpatient detox °• Accepts private insurance 336-389-1413 °[address] °[place], [place]  °[facility] • Individual counseling °• Accepts Medicare, private insurance 336-632-3505 °[address] °[place], [place] [address]  °[facility] • Individual counseling °• Accepts Medicare, private insurance 336-570-0104 °[address] °[place], [place] [address]  °[facility] ° • Offers substance abuse  Intensive Outpatient Program (several hours a day, several times a week) [phone], or °[phone] °[place], [place]  ° °

## 2016-04-02 ENCOUNTER — Emergency Department (HOSPITAL_COMMUNITY)
Admission: EM | Admit: 2016-04-02 | Discharge: 2016-04-02 | Disposition: A | Discharge status: Home / Self Care | Payer: Medicaid Other | Attending: Emergency Medicine | Admitting: Emergency Medicine

## 2016-04-02 ENCOUNTER — Encounter (HOSPITAL_COMMUNITY): Payer: Self-pay | Admitting: Emergency Medicine

## 2016-04-02 ENCOUNTER — Emergency Department (HOSPITAL_COMMUNITY): Payer: Medicaid Other

## 2016-04-02 DIAGNOSIS — Z872 Personal history of diseases of the skin and subcutaneous tissue: Secondary | ICD-10-CM | POA: Diagnosis not present

## 2016-04-02 DIAGNOSIS — F17210 Nicotine dependence, cigarettes, uncomplicated: Secondary | ICD-10-CM | POA: Diagnosis not present

## 2016-04-02 DIAGNOSIS — W228XXA Striking against or struck by other objects, initial encounter: Secondary | ICD-10-CM | POA: Diagnosis not present

## 2016-04-02 DIAGNOSIS — J45909 Unspecified asthma, uncomplicated: Secondary | ICD-10-CM | POA: Diagnosis not present

## 2016-04-02 DIAGNOSIS — Y9389 Activity, other specified: Secondary | ICD-10-CM | POA: Insufficient documentation

## 2016-04-02 DIAGNOSIS — Y9289 Other specified places as the place of occurrence of the external cause: Secondary | ICD-10-CM | POA: Insufficient documentation

## 2016-04-02 DIAGNOSIS — T148XXA Other injury of unspecified body region, initial encounter: Secondary | ICD-10-CM

## 2016-04-02 DIAGNOSIS — S6991XA Unspecified injury of right wrist, hand and finger(s), initial encounter: Secondary | ICD-10-CM | POA: Diagnosis present

## 2016-04-02 DIAGNOSIS — Z79899 Other long term (current) drug therapy: Secondary | ICD-10-CM | POA: Diagnosis not present

## 2016-04-02 DIAGNOSIS — T148 Other injury of unspecified body region: Secondary | ICD-10-CM | POA: Insufficient documentation

## 2016-04-02 DIAGNOSIS — F319 Bipolar disorder, unspecified: Secondary | ICD-10-CM | POA: Diagnosis not present

## 2016-04-02 DIAGNOSIS — Y998 Other external cause status: Secondary | ICD-10-CM | POA: Insufficient documentation

## 2016-04-02 MED ORDER — OXYCODONE-ACETAMINOPHEN 5-325 MG PO TABS
1.0000 | ORAL_TABLET | Freq: Once | ORAL | Status: AC
  Administered 2016-04-02: 1 via ORAL
  Filled 2016-04-02: qty 1

## 2016-04-02 MED ORDER — TRAMADOL HCL 50 MG PO TABS: 50.0000 mg | ORAL_TABLET | Freq: Four times a day (QID) | ORAL | Status: DC | PRN

## 2016-04-02 MED ORDER — IBUPROFEN 800 MG PO TABS: 800.0000 mg | ORAL_TABLET | Freq: Three times a day (TID) | ORAL | Status: DC

## 2016-04-02 NOTE — ED Provider Notes (Signed)
CSN: [id number]     Arrival date & time 04/02/16  0154 History  By signing my name below, I, Emmanuella Mensah, attest that this documentation has been prepared under the direction and in the presence of Gilda Crease, MD. Electronically Signed: Angelene Giovanni, ED Scribe. 04/02/2016. 2:41 AM.    Chief Complaint  Patient presents with  . Hand Pain   Patient is a 25 y.o. male presenting with hand pain. The history is provided by the patient. No language interpreter was used.  Hand Pain This is a new problem. The current episode started 3 to 5 hours ago. The problem has been gradually worsening. Pertinent negatives include no chest pain, no abdominal pain, no headaches and no shortness of breath. Nothing aggravates the symptoms. Nothing relieves the symptoms. He has tried nothing for the symptoms.   HPI Comments: Patrick James is a 25 y.o. male who presents to the Emergency Department complaining of gradually worsening moderate pain to the dorsum aspect of his right hand s/p hand injury that occurred several hours ago. Pt explains that he punched a brick wall. He reports that the pain is worse with ROM of the hand. No alleviating factors noted. Pt did not take any medications PTA. No open wounds.    Past Medical History  Diagnosis Date  . Eczema   . ADHD (attention deficit hyperactivity disorder)   . Depression   . Scabies   . ADHD (attention deficit hyperactivity disorder)   . Bipolar 1 disorder (HCC)   . Asthma    Past Surgical History  Procedure Laterality Date  . Eye surgery     Family History  Problem Relation Age of Onset  . Hypertension Other    Social History  Substance Use Topics  . Smoking status: Current Every Day Smoker    Types: Cigars  . Smokeless tobacco: None  . Alcohol Use: No    Review of Systems  Respiratory: Negative for shortness of breath.   Cardiovascular: Negative for chest pain.  Gastrointestinal: Negative for abdominal pain.   Musculoskeletal: Positive for arthralgias (right hand).  Skin: Negative for rash and wound.  Neurological: Negative for headaches.  All other systems reviewed and are negative.     Allergies  Review of patient's allergies indicates no known allergies.  Home Medications   Prior to Admission medications   Medication Sig Start Date End Date Taking? Authorizing Provider  amoxicillin (AMOXIL) 500 MG capsule Take 1 capsule (500 mg total) by mouth 3 (three) times daily. Patient not taking: Reported on 01/20/2016 06/15/15   Jaynie Crumble, PA-C  Ascorbic Acid (VITAMIN C) 1000 MG tablet Take 1,000 mg by mouth daily.    Historical Provider, MD  cholecalciferol (VITAMIN D) 1000 units tablet Take 1,000 Units by mouth daily.    Historical Provider, MD  cyclobenzaprine (FLEXERIL) 10 MG tablet Take 1 tablet (10 mg total) by mouth 2 (two) times daily as needed for muscle spasms. Patient not taking: Reported on 01/20/2016 03/24/15   Emilia Beck, PA-C  meloxicam (MOBIC) 15 MG tablet Take 1 tablet (15 mg total) by mouth daily. Patient not taking: Reported on 01/20/2016 03/24/15   Emilia Beck, PA-C  naproxen (NAPROSYN) 500 MG tablet Take 1 tablet (500 mg total) by mouth 2 (two) times daily with a meal. Patient not taking: Reported on 01/20/2016 03/15/14   Cristobal Goldmann, PA-C  ondansetron (ZOFRAN-ODT) 4 MG disintegrating tablet Take 1 tablet (4 mg total) by mouth once as needed for nausea. Patient not taking:  Reported on 01/20/2016 06/20/15   Lenell AntuJamie Wright, MD  PARoxetine (PAXIL) 20 MG tablet Take 20 mg by mouth daily.    Historical Provider, MD  traZODone (DESYREL) 50 MG tablet Take 50 mg by mouth at bedtime. 04/11/15   Historical Provider, MD  VYVANSE 40 MG capsule Take 40 mg by mouth every morning. 06/17/15   Historical Provider, MD   BP 134/91 mmHg  Pulse 84  Temp(Src) 97.8 F (36.6 C) (Oral)  Resp 20  SpO2 99% Physical Exam  Constitutional: He is oriented to person, place, and time. He  appears well-developed and well-nourished. No distress.  HENT:  Head: Normocephalic and atraumatic.  Right Ear: Hearing normal.  Left Ear: Hearing normal.  Nose: Nose normal.  Mouth/Throat: Oropharynx is clear and moist and mucous membranes are normal.  Eyes: Conjunctivae and EOM are normal. Pupils are equal, round, and reactive to light.  Neck: Normal range of motion. Neck supple.  Cardiovascular: Regular rhythm, S1 normal and S2 normal.  Exam reveals no gallop and no friction rub.   No murmur heard. Pulmonary/Chest: Effort normal and breath sounds normal. No respiratory distress. He exhibits no tenderness.  Abdominal: Soft. Normal appearance and bowel sounds are normal. There is no hepatosplenomegaly. There is no tenderness. There is no rebound, no guarding, no tenderness at McBurney's point and negative Murphy's sign. No hernia.  Musculoskeletal: Normal range of motion.  Tenderness over right 2nd and 3rd distal metacarpal  Neurological: He is alert and oriented to person, place, and time. He has normal strength. No cranial nerve deficit or sensory deficit. Coordination normal. GCS eye subscore is 4. GCS verbal subscore is 5. GCS motor subscore is 6.  Skin: Skin is warm, dry and intact. No rash noted. No cyanosis.  Psychiatric: He has a normal mood and affect. His speech is normal and behavior is normal. Thought content normal.  Nursing note and vitals reviewed.   ED Course  Procedures (including critical care time) DIAGNOSTIC STUDIES: Oxygen Saturation is 99% on RA, normal by my interpretation.    COORDINATION OF CARE: 2:28 AM- Pt advised of plan for treatment and pt agrees. Pt will receive x-ray with no relief.    Labs Review Labs Reviewed - No data to display  Imaging Review Dg Hand Complete Right  04/02/2016  CLINICAL DATA:  Acute onset of right second and third distal metacarpal pain and swelling after punching wall. Initial encounter. EXAM: RIGHT HAND - COMPLETE 3+ VIEW  COMPARISON:  Right hand radiographs performed 11/21/2013 FINDINGS: There is no evidence of fracture or dislocation. The joint spaces are preserved. The carpal rows are intact, and demonstrate normal alignment. The soft tissues are unremarkable in appearance. IMPRESSION: No evidence of fracture or dislocation. Electronically Signed   By: Roanna RaiderJeffery  Chang M.D.   On: 04/02/2016 02:43     Gilda Creasehristopher J Rayley Gao, MD has personally reviewed and evaluated these images and lab results as part of his medical decision-making.   EKG Interpretation None      MDM   Final diagnoses:  None  contusion   I personally performed the services described in this documentation, which was scribed in my presence. The recorded information has been reviewed and is accurate.   Gilda Creasehristopher J Albin Duckett, MD 04/02/16 [phone]

## 2016-04-02 NOTE — ED Notes (Signed)
C/o R hand pain since punching a brick wall tonight.

## 2017-08-01 ENCOUNTER — Encounter (HOSPITAL_COMMUNITY): Payer: Self-pay | Admitting: Emergency Medicine

## 2017-08-01 ENCOUNTER — Emergency Department (HOSPITAL_COMMUNITY): Payer: Medicaid Other

## 2017-08-01 ENCOUNTER — Emergency Department (HOSPITAL_COMMUNITY)
Admission: EM | Admit: 2017-08-01 | Discharge: 2017-08-01 | Disposition: A | Discharge status: Home / Self Care | Payer: Medicaid Other | Attending: Emergency Medicine | Admitting: Emergency Medicine

## 2017-08-01 DIAGNOSIS — H9202 Otalgia, left ear: Secondary | ICD-10-CM | POA: Diagnosis present

## 2017-08-01 DIAGNOSIS — Z791 Long term (current) use of non-steroidal anti-inflammatories (NSAID): Secondary | ICD-10-CM | POA: Diagnosis not present

## 2017-08-01 DIAGNOSIS — R05 Cough: Secondary | ICD-10-CM | POA: Insufficient documentation

## 2017-08-01 DIAGNOSIS — J069 Acute upper respiratory infection, unspecified: Secondary | ICD-10-CM

## 2017-08-01 DIAGNOSIS — F17290 Nicotine dependence, other tobacco product, uncomplicated: Secondary | ICD-10-CM | POA: Diagnosis not present

## 2017-08-01 DIAGNOSIS — F319 Bipolar disorder, unspecified: Secondary | ICD-10-CM | POA: Diagnosis not present

## 2017-08-01 DIAGNOSIS — H6122 Impacted cerumen, left ear: Secondary | ICD-10-CM | POA: Insufficient documentation

## 2017-08-01 DIAGNOSIS — Z79899 Other long term (current) drug therapy: Secondary | ICD-10-CM | POA: Diagnosis not present

## 2017-08-01 DIAGNOSIS — R51 Headache: Secondary | ICD-10-CM | POA: Insufficient documentation

## 2017-08-01 DIAGNOSIS — J45909 Unspecified asthma, uncomplicated: Secondary | ICD-10-CM | POA: Diagnosis not present

## 2017-08-01 DIAGNOSIS — J399 Disease of upper respiratory tract, unspecified: Secondary | ICD-10-CM | POA: Insufficient documentation

## 2017-08-01 DIAGNOSIS — F909 Attention-deficit hyperactivity disorder, unspecified type: Secondary | ICD-10-CM | POA: Diagnosis not present

## 2017-08-01 MED ORDER — DOCUSATE SODIUM 100 MG PO CAPS
100.0000 mg | ORAL_CAPSULE | Freq: Once | ORAL | Status: AC
  Administered 2017-08-01: 100 mg via ORAL
  Filled 2017-08-01: qty 1

## 2017-08-01 MED ORDER — CARBAMIDE PEROXIDE 6.5 % OT SOLN: 5.0000 [drp] | Freq: Two times a day (BID) | OTIC | 0 refills | Status: DC

## 2017-08-01 MED ORDER — ALBUTEROL SULFATE (2.5 MG/3ML) 0.083% IN NEBU
5.0000 mg | INHALATION_SOLUTION | Freq: Once | RESPIRATORY_TRACT | Status: AC
  Administered 2017-08-01: 5 mg via RESPIRATORY_TRACT
  Filled 2017-08-01: qty 6

## 2017-08-01 MED ORDER — BENZONATATE 100 MG PO CAPS: 100.0000 mg | ORAL_CAPSULE | Freq: Three times a day (TID) | ORAL | 0 refills | Status: DC

## 2017-08-01 NOTE — ED Notes (Signed)
ED Provider at bedside. 

## 2017-08-01 NOTE — ED Triage Notes (Signed)
Pt sts left sided facial pain and ear pain x 3 days

## 2017-08-01 NOTE — ED Notes (Signed)
Attempted to irrigate pt's right ear; pt not tolerating; David,NP notified

## 2017-08-01 NOTE — ED Provider Notes (Signed)
[facility]-EMERGENCY DEPT Provider Note   CSN: [id number] Arrival date & time: 08/01/17  1803     History   Chief Complaint Chief Complaint  Patient presents with  . Facial Pain  . Otalgia    HPI Patrick James is a 26 y.o. male.  The history is provided by the patient. No language interpreter was used.  Otalgia  The current episode started more than 2 days ago. There is pain in the left ear. The problem occurs constantly. The problem has not changed since onset.There has been no fever. The pain is moderate. Associated symptoms include headaches, rhinorrhea and cough. Pertinent negatives include no ear discharge.  Cough  This is a new problem. The current episode started more than 2 days ago. The problem occurs every few hours. The problem has been gradually worsening. The cough is non-productive. There has been no fever. Associated symptoms include ear pain, headaches, rhinorrhea and wheezing. He has tried nothing for the symptoms. His past medical history does not include asthma.    Past Medical History:  Diagnosis Date  . ADHD (attention deficit hyperactivity disorder)   . ADHD (attention deficit hyperactivity disorder)   . Asthma   . Bipolar 1 disorder (HCC)   . Depression   . Eczema   . Scabies     There are no active problems to display for this patient.   Past Surgical History:  Procedure Laterality Date  . EYE SURGERY         Home Medications    Prior to Admission medications   Medication Sig Start Date End Date Taking? Authorizing Provider  Ascorbic Acid (VITAMIN C) 1000 MG tablet Take 1,000 mg by mouth daily.    [provider]  cholecalciferol (VITAMIN D) 1000 units tablet Take 1,000 Units by mouth daily.    [provider]  ibuprofen (ADVIL,MOTRIN) 800 MG tablet Take 1 tablet (800 mg total) by mouth 3 (three) times daily. 04/02/16   Gilda Crease, MD  meloxicam (MOBIC) 15 MG tablet Take 1 tablet (15 mg total) by mouth  daily. Patient not taking: Reported on 01/20/2016 03/24/15   Emilia Beck, PA-C  PARoxetine (PAXIL) 20 MG tablet Take 20 mg by mouth daily.    [provider]  traMADol (ULTRAM) 50 MG tablet Take 1 tablet (50 mg total) by mouth every 6 (six) hours as needed. 04/02/16   Gilda Crease, MD  traZODone (DESYREL) 50 MG tablet Take 50 mg by mouth at bedtime. 04/11/15   [provider]  VYVANSE 40 MG capsule Take 40 mg by mouth every morning. 06/17/15   [provider]    Family History Family History  Problem Relation Age of Onset  . Hypertension Other     Social History Social History  Substance Use Topics  . Smoking status: Current Every Day Smoker    Types: Cigars  . Smokeless tobacco: Not on file  . Alcohol use No     Allergies   Patient has no known allergies.   Review of Systems Review of Systems  HENT: Positive for ear pain, rhinorrhea and sinus pressure. Negative for ear discharge.   Respiratory: Positive for cough and wheezing.   Neurological: Positive for headaches.  All other systems reviewed and are negative.    Physical Exam Updated Vital Signs BP (!) 141/85 (BP Location: Right Arm)   Pulse 78   Temp 98.4 F (36.9 C) (Oral)   Resp 18   Ht 6\' 1"  (1.854 m)  Wt (!) 145.2 kg (320 lb)   SpO2 97%   BMI 42.22 kg/m   Physical Exam  Constitutional: He is oriented to person, place, and time. He appears well-developed and well-nourished.  HENT:  Head: Atraumatic.  Left TM blocked by cerumen impaction. Mild left maxillary tenderness to percussion.  Eyes: Conjunctivae are normal.  Neck: Neck supple.  Cardiovascular: Normal rate and regular rhythm.   Pulmonary/Chest: He has wheezes.  Abdominal: Soft. Bowel sounds are normal.  Musculoskeletal: Normal range of motion.  Lymphadenopathy:    He has no cervical adenopathy.  Neurological: He is alert and oriented to person, place, and time.  Skin: Skin is warm and dry.   Psychiatric: He has a normal mood and affect.  Nursing note and vitals reviewed.    ED Treatments / Results  Labs (all labs ordered are listed, but only abnormal results are displayed) Labs Reviewed - No data to display  EKG  EKG Interpretation None       Radiology No results found.  Procedures Procedures (including critical care time)  Medications Ordered in ED Medications - No data to display   Initial Impression / Assessment and Plan / ED Course  I have reviewed the triage vital signs and the nursing notes.  Pertinent labs & imaging results that were available during my care of the patient were reviewed by me and considered in my medical decision making (see chart for details).     Pt symptoms consistent with URI. CXR negative for acute infiltrate. Pt will be discharged with symptomatic treatment.  Discussed return precautions.  Pt is hemodynamically stable & in NAD prior to discharge.  Pt presenting with cerumen impaction of left ear with otalgia  Pt afebrile in NAD. Unable to remove wax in ED.  Discharge with debrox prescription.  Advised follow up with their PCP if no improvement or results.  Return precautions discussed. Pt appears safe for discharge.  Final Clinical Impressions(s) / ED Diagnoses   Final diagnoses:  Viral upper respiratory tract infection  Impacted cerumen of left ear    New Prescriptions New Prescriptions   BENZONATATE (TESSALON) 100 MG CAPSULE    Take 1 capsule (100 mg total) by mouth every 8 (eight) hours.   CARBAMIDE PEROXIDE (DEBROX) 6.5 % OTIC SOLUTION    Place 5 drops into the left ear 2 (two) times daily.     Felicie Morn, NP 08/01/17 9030    Pricilla Loveless, MD 08/02/17 [phone]

## 2017-11-05 ENCOUNTER — Other Ambulatory Visit: Payer: Self-pay

## 2017-11-05 ENCOUNTER — Emergency Department (HOSPITAL_COMMUNITY)
Admission: EM | Admit: 2017-11-05 | Discharge: 2017-11-05 | Disposition: A | Discharge status: Home / Self Care | Payer: Medicaid Other | Attending: Emergency Medicine | Admitting: Emergency Medicine

## 2017-11-05 ENCOUNTER — Encounter (HOSPITAL_COMMUNITY): Payer: Self-pay | Admitting: Emergency Medicine

## 2017-11-05 DIAGNOSIS — R070 Pain in throat: Secondary | ICD-10-CM | POA: Diagnosis present

## 2017-11-05 DIAGNOSIS — J45909 Unspecified asthma, uncomplicated: Secondary | ICD-10-CM | POA: Insufficient documentation

## 2017-11-05 DIAGNOSIS — F909 Attention-deficit hyperactivity disorder, unspecified type: Secondary | ICD-10-CM | POA: Diagnosis not present

## 2017-11-05 DIAGNOSIS — J029 Acute pharyngitis, unspecified: Secondary | ICD-10-CM | POA: Diagnosis not present

## 2017-11-05 DIAGNOSIS — F17290 Nicotine dependence, other tobacco product, uncomplicated: Secondary | ICD-10-CM | POA: Insufficient documentation

## 2017-11-05 LAB — RAPID STREP SCREEN (MED CTR MEBANE ONLY): STREPTOCOCCUS, GROUP A SCREEN (DIRECT): NEGATIVE

## 2017-11-05 MED ORDER — DEXAMETHASONE SODIUM PHOSPHATE 10 MG/ML IJ SOLN
10.0000 mg | Freq: Once | INTRAMUSCULAR | Status: AC
  Administered 2017-11-05: 10 mg via INTRAMUSCULAR
  Filled 2017-11-05: qty 1

## 2017-11-05 NOTE — ED Triage Notes (Signed)
Pt to ER for 3 days sore throat. States body aches and chills. VSS

## 2017-11-05 NOTE — Discharge Instructions (Signed)

## 2017-11-05 NOTE — ED Provider Notes (Signed)
Emergency Department Provider Note   I have reviewed the triage vital signs and the nursing notes.   HISTORY  Chief Complaint Sore Throat   HPI Patrick James is a 26 y.o. male presents to the emergency department for evaluation of sore throat for the past 3 days.  He has had associated body aches and some chills.  No fevers.  He has pain with swallowing but is managing his saliva, food, drinks.  No sick contacts.  No difficulty breathing or chest pain.  Denies any vomiting or diarrhea.  Symptoms worse with swallowing.    Past Medical History:  Diagnosis Date  . ADHD (attention deficit hyperactivity disorder)   . ADHD (attention deficit hyperactivity disorder)   . Asthma   . Bipolar 1 disorder (HCC)   . Depression   . Eczema   . Scabies     There are no active problems to display for this patient.   Past Surgical History:  Procedure Laterality Date  . EYE SURGERY      Current Outpatient Rx  . Order #: [id number] Class: Historical Med  . Order #: [id number] Class: Print  . Order #: [id number] Class: Print  . Order #: [id number] Class: Historical Med  . Order #: [id number] Class: Print  . Order #: [id number] Class: Print  . Order #: [id number] Class: Historical Med  . Order #: [id number] Class: Print  . Order #: [id number] Class: Historical Med  . Order #: [id number] Class: Historical Med    Allergies Patient has no known allergies.  Family History  Problem Relation Age of Onset  . Hypertension Other     Social History Social History   Tobacco Use  . Smoking status: Current Every Day Smoker    Types: Cigars  . Smokeless tobacco: Never Used  Substance Use Topics  . Alcohol use: No  . Drug use: No    Review of Systems  Constitutional: Positive subjective fever/chills Eyes: No visual changes. ENT: Positive sore throat. Cardiovascular: Denies chest pain. Respiratory: Denies shortness of breath. Gastrointestinal: No abdominal pain.  No nausea, no vomiting.  No  diarrhea.  No constipation. Genitourinary: Negative for dysuria. Musculoskeletal: Negative for back pain. Skin: Negative for rash. Neurological: Negative for headaches, focal weakness or numbness.  10-point ROS otherwise negative.  ____________________________________________   PHYSICAL EXAM:  VITAL SIGNS: ED Triage Vitals [11/05/17 1415]  Enc Vitals Group     BP (!) 141/87     Pulse Rate (!) 104     Resp 18     Temp 98.8 F (37.1 C)     Temp Source Oral     SpO2 96 %     Pain Score 6   Constitutional: Alert and oriented. Well appearing and in no acute distress. Eyes: Conjunctivae are normal. Head: Atraumatic. Nose: No congestion/rhinnorhea. Mouth/Throat: Mucous membranes are moist.  Oropharynx with erythema, mild hypertrophy, and scant exudate. No PTA. Managing oral secretions. Speaking in a clear voice.  Neck: No stridor.  No meningeal signs.  Cardiovascular: Normal rate, regular rhythm. Good peripheral circulation. Grossly normal heart sounds.   Respiratory: Normal respiratory effort.  No retractions. Lungs CTAB. Gastrointestinal: Soft and nontender. No distention.  Musculoskeletal: No lower extremity tenderness nor edema. No gross deformities of extremities. Neurologic:  Normal speech and language. No gross focal neurologic deficits are appreciated.  Skin:  Skin is warm, dry and intact. No rash noted.  ____________________________________________   LABS (all labs ordered are listed, but only abnormal results are displayed)  Labs Reviewed  RAPID STREP SCREEN (NOT AT  [facility])  CULTURE, GROUP A STREP [facility]   ____________________________________________   PROCEDURES  Procedure(s) performed:   Procedures  None ____________________________________________   INITIAL IMPRESSION / ASSESSMENT AND PLAN / ED COURSE  Pertinent labs & imaging results that were available during my care of the patient were reviewed by me and considered in my medical decision making  (see chart for details).  Patient presents to the emergency department for evaluation of sore throat.  He has minimal exudate with no evidence of peritonsillar abscess.  Managing oral secretions and speaking in a clear voice.  No evidence to suggest deep space neck infection. Plan for discharge after IM Decadron.  Advised cool liquids, rest, Motrin for pain.   At this time, I do not feel there is any life-threatening condition present. I have reviewed and discussed all results (EKG, imaging, lab, urine as appropriate), exam findings with patient. I have reviewed nursing notes and appropriate previous records.  I feel the patient is safe to be discharged home without further emergent workup. Discussed usual and customary return precautions. Patient and family (if present) verbalize understanding and are comfortable with this plan.  Patient will follow-up with their primary care provider. If they do not have a primary care provider, information for follow-up has been provided to them. All questions have been answered.  ____________________________________________  FINAL CLINICAL IMPRESSION(S) / ED DIAGNOSES  Final diagnoses:  Viral pharyngitis     MEDICATIONS GIVEN DURING THIS VISIT:  Medications  dexamethasone (DECADRON) injection 10 mg (10 mg Intramuscular Given 11/05/17 1655)     Note:  This document was prepared using Dragon voice recognition software and may include unintentional dictation errors.  Alona BeneJoshua Captain Blucher, MD Emergency Medicine    Caelum Federici, Arlyss RepressJoshua G, MD 11/06/17 [phone]

## 2017-11-06 LAB — CULTURE, GROUP A STREP (THRC)

## 2017-11-07 ENCOUNTER — Telehealth: Payer: Self-pay | Admitting: Emergency Medicine

## 2017-11-07 NOTE — Progress Notes (Signed)
ED Antimicrobial Stewardship Positive Culture Follow Up   Patrick James is an 26 y.o. male who [facility] with a chief complaint of  Chief Complaint  Patient presents with  . Sore Throat    Recent Results (from the past 720 hour(s))  Rapid strep screen     Status: None   Collection Time: 11/05/17  2:16 PM  Result Value Ref Range Status   Streptococcus, Group A Screen (Direct) NEGATIVE NEGATIVE Final    Comment: (NOTE) A Rapid Antigen test may result negative if the antigen level in the sample is below the detection level of this test. The FDA has not cleared this test as a stand-alone test therefore the rapid antigen negative result has reflexed to a Group A Strep culture.   Culture, group A strep     Status: None   Collection Time: 11/05/17  2:16 PM  Result Value Ref Range Status   Specimen Description THROAT  Final   Special Requests NONE Reflexed from [id number]  Final   Culture MODERATE GROUP A STREP (S.PYOGENES) ISOLATED  Final   Report Status 11/06/2017 FINAL  Final    [x]  Patient discharged originally without antimicrobial agent and treatment is now indicated  New antibiotic prescription: Penicillin VK 500 mg twice daily for 10 days  ED Provider: Dietrich PatesHina Khatri, PA-C  Rolley SimsMartin, Karlena Luebke Ann 11/07/2017, 8:59 AM Infectious Diseases Pharmacist Phone# [phone]

## 2017-11-07 NOTE — Telephone Encounter (Signed)
Post ED Visit - Positive Culture Follow-up: Successful Patient Follow-Up  Culture assessed and recommendations reviewed by: []  Enzo BiNathan Batchelder, Pharm.D. []  Celedonio MiyamotoJeremy Frens, Pharm.D., BCPS AQ-ID []  Garvin FilaMike Maccia, Pharm.D., BCPS [x]  Georgina PillionElizabeth Martin, Pharm.D., BCPS []  FishhookMinh Pham, [address] BoulevardPharm.D., BCPS, AAHIVP []  Estella HuskMichelle Turner, Pharm.D., BCPS, AAHIVP []  Lysle Pearlachel Rumbarger, PharmD, BCPS []  Casilda Carlsaylor Stone, PharmD, BCPS []  Pollyann SamplesAndy Johnston, PharmD, BCPS  Positive strep culture  [x]  Patient discharged without antimicrobial prescription and treatment is now indicated []  Organism is resistant to prescribed ED discharge antimicrobial []  Patient with positive blood cultures  Changes discussed with ED provider: Dietrich PatesHina Khatri PA New antibiotic prescription Penicillin VK 500mg  po bid x 10 days [facility]  Contacted patient, 11/07/2017 1123  Patrick MullMiller, Patrick James 11/07/2017, 11:22 AM

## 2017-12-12 ENCOUNTER — Emergency Department (HOSPITAL_COMMUNITY)
Admission: EM | Admit: 2017-12-12 | Discharge: 2017-12-12 | Disposition: A | Discharge status: Home / Self Care | Payer: Medicaid Other | Attending: Emergency Medicine | Admitting: Emergency Medicine

## 2017-12-12 ENCOUNTER — Encounter (HOSPITAL_COMMUNITY): Payer: Self-pay | Admitting: Emergency Medicine

## 2017-12-12 DIAGNOSIS — Z5321 Procedure and treatment not carried out due to patient leaving prior to being seen by health care provider: Secondary | ICD-10-CM | POA: Diagnosis not present

## 2017-12-12 DIAGNOSIS — R197 Diarrhea, unspecified: Secondary | ICD-10-CM | POA: Diagnosis present

## 2017-12-12 LAB — URINALYSIS, ROUTINE W REFLEX MICROSCOPIC
Bilirubin Urine: NEGATIVE
Glucose, UA: NEGATIVE mg/dL
Hgb urine dipstick: NEGATIVE
Ketones, ur: NEGATIVE mg/dL
LEUKOCYTES UA: NEGATIVE
NITRITE: NEGATIVE
Protein, ur: NEGATIVE mg/dL
SPECIFIC GRAVITY, URINE: 1.026 (ref 1.005–1.030)
pH: 6 (ref 5.0–8.0)

## 2017-12-12 LAB — COMPREHENSIVE METABOLIC PANEL
ALK PHOS: 72 U/L (ref 38–126)
ALT: 41 U/L (ref 17–63)
ANION GAP: 9 (ref 5–15)
AST: 29 U/L (ref 15–41)
Albumin: 4.1 g/dL (ref 3.5–5.0)
BILIRUBIN TOTAL: 0.8 mg/dL (ref 0.3–1.2)
BUN: 10 mg/dL (ref 6–20)
CALCIUM: 9.3 mg/dL (ref 8.9–10.3)
CO2: 24 mmol/L (ref 22–32)
Chloride: 104 mmol/L (ref 101–111)
Creatinine, Ser: 1.1 mg/dL (ref 0.61–1.24)
GFR calc non Af Amer: >60 mL/min (ref >60)
Glucose, Bld: 100 mg/dL — ABNORMAL HIGH (ref 65–99)
POTASSIUM: 3.8 mmol/L (ref 3.5–5.1)
SODIUM: 137 mmol/L (ref 135–145)
TOTAL PROTEIN: 7.7 g/dL (ref 6.5–8.1)

## 2017-12-12 LAB — LIPASE, BLOOD: Lipase: 32 U/L (ref 11–51)

## 2017-12-12 LAB — CBC
HEMATOCRIT: 45.9 % (ref 39.0–52.0)
HEMOGLOBIN: 16 g/dL (ref 13.0–17.0)
MCH: 30.8 pg (ref 26.0–34.0)
MCHC: 34.9 g/dL (ref 30.0–36.0)
MCV: 88.4 fL (ref 78.0–100.0)
Platelets: 218 10*3/uL (ref 150–400)
RBC: 5.19 MIL/uL (ref 4.22–5.81)
RDW: 12.8 % (ref 11.5–15.5)
WBC: 11.9 10*3/uL — ABNORMAL HIGH (ref 4.0–10.5)

## 2017-12-12 NOTE — ED Notes (Signed)
Attempted to call pt X2, no answer

## 2017-12-12 NOTE — ED Triage Notes (Signed)
Patient arrived with PTAR from home reports multiple diarrhea onset yesterday with mild low abdominal cramping , denies emesis or fever .

## 2018-01-15 ENCOUNTER — Encounter (HOSPITAL_COMMUNITY): Payer: Self-pay

## 2018-01-15 ENCOUNTER — Emergency Department (HOSPITAL_COMMUNITY): Payer: Medicaid Other

## 2018-01-15 ENCOUNTER — Emergency Department (HOSPITAL_COMMUNITY)
Admission: EM | Admit: 2018-01-15 | Discharge: 2018-01-15 | Disposition: A | Discharge status: Home / Self Care | Payer: Medicaid Other | Attending: Emergency Medicine | Admitting: Emergency Medicine

## 2018-01-15 DIAGNOSIS — R0981 Nasal congestion: Secondary | ICD-10-CM | POA: Diagnosis present

## 2018-01-15 DIAGNOSIS — Z79899 Other long term (current) drug therapy: Secondary | ICD-10-CM | POA: Diagnosis not present

## 2018-01-15 DIAGNOSIS — F17210 Nicotine dependence, cigarettes, uncomplicated: Secondary | ICD-10-CM | POA: Diagnosis not present

## 2018-01-15 DIAGNOSIS — J069 Acute upper respiratory infection, unspecified: Secondary | ICD-10-CM | POA: Diagnosis not present

## 2018-01-15 MED ORDER — LEVOCETIRIZINE DIHYDROCHLORIDE 5 MG PO TABS: 5.0000 mg | ORAL_TABLET | Freq: Every evening | ORAL | 0 refills | Status: DC

## 2018-01-15 MED ORDER — ALBUTEROL SULFATE HFA 108 (90 BASE) MCG/ACT IN AERS
2.0000 | INHALATION_SPRAY | Freq: Once | RESPIRATORY_TRACT | Status: AC
  Administered 2018-01-15: 2 via RESPIRATORY_TRACT
  Filled 2018-01-15: qty 6.7

## 2018-01-15 MED ORDER — DEXAMETHASONE SODIUM PHOSPHATE 10 MG/ML IJ SOLN
10.0000 mg | Freq: Once | INTRAMUSCULAR | Status: AC
  Administered 2018-01-15: 10 mg via INTRAMUSCULAR
  Filled 2018-01-15: qty 1

## 2018-01-15 MED ORDER — PROMETHAZINE-DM 6.25-15 MG/5ML PO SYRP: 5.0000 mL | ORAL_SOLUTION | Freq: Four times a day (QID) | ORAL | 0 refills | Status: DC | PRN

## 2018-01-15 MED ORDER — PREDNISONE 10 MG PO TABS: 40.0000 mg | ORAL_TABLET | Freq: Every day | ORAL | 0 refills | Status: AC

## 2018-01-15 NOTE — ED Provider Notes (Signed)
[facility]ENC[facility] Provider Note   CSN: [id number] Arrival date & time: 01/15/18  1046     History   Chief Complaint No chief complaint on file.   HPI Patrick James is a 27 y.o. male presenting for evaluation of nasal congestion and cough.  Patient states for the past 2 days, he has had nasal congestion, cough, sinus pain.  His cough is mildly productive, worse at night.  He has not taken anything for his symptoms including Tylenol or ibuprofen.  He denies fevers, chills, ear pain, eye pain, sore throat, chest pain, difficulty breathing, nausea, vomiting, abdominal pain.  He has associated wheezing, states he has a history of asthma but does not take medications for this.  He does not have an albuterol inhaler.  He denies history of COPD.  He is not a smoker.  He denies sick contacts.  HPI  Past Medical History:  Diagnosis Date  . ADHD (attention deficit hyperactivity disorder)   . ADHD (attention deficit hyperactivity disorder)   . Asthma   . Bipolar 1 disorder (HCC)   . Depression   . Eczema   . Scabies     There are no active problems to display for this patient.   Past Surgical History:  Procedure Laterality Date  . EYE SURGERY         Home Medications    Prior to Admission medications   Medication Sig Start Date End Date Taking? Authorizing Provider  Ascorbic Acid (VITAMIN C) 1000 MG tablet Take 1,000 mg by mouth daily.    [provider]  benzonatate (TESSALON) 100 MG capsule Take 1 capsule (100 mg total) by mouth every 8 (eight) hours. 08/01/17   Felicie MornSmith, David, NP  carbamide peroxide (DEBROX) 6.5 % OTIC solution Place 5 drops into the left ear 2 (two) times daily. 08/01/17   Felicie MornSmith, David, NP  cholecalciferol (VITAMIN D) 1000 units tablet Take 1,000 Units by mouth daily.    [provider]  ibuprofen (ADVIL,MOTRIN) 800 MG tablet Take 1 tablet (800 mg total) by mouth 3 (three) times daily. 04/02/16   Gilda CreasePollina,  Christopher J, MD  levocetirizine (XYZAL) 5 MG tablet Take 1 tablet (5 mg total) by mouth every evening. 01/15/18   Yalonda Sample, PA-C  meloxicam (MOBIC) 15 MG tablet Take 1 tablet (15 mg total) by mouth daily. Patient not taking: Reported on 01/20/2016 03/24/15   Emilia BeckSzekalski, Kaitlyn, PA-C  PARoxetine (PAXIL) 20 MG tablet Take 20 mg by mouth daily.    [provider]  predniSONE (DELTASONE) 10 MG tablet Take 4 tablets (40 mg total) by mouth daily for 4 days. 01/15/18 01/19/18  Malory Spurr, PA-C  promethazine-dextromethorphan (PROMETHAZINE-DM) 6.25-15 MG/5ML syrup Take 5 mLs by mouth 4 (four) times daily as needed. 01/15/18   Patsy Varma, PA-C  traMADol (ULTRAM) 50 MG tablet Take 1 tablet (50 mg total) by mouth every 6 (six) hours as needed. 04/02/16   Gilda CreasePollina, Christopher J, MD  traZODone (DESYREL) 50 MG tablet Take 50 mg by mouth at bedtime. 04/11/15   [provider]  VYVANSE 40 MG capsule Take 40 mg by mouth every morning. 06/17/15   [provider]    Family History Family History  Problem Relation Age of Onset  . Hypertension Other     Social History Social History   Tobacco Use  . Smoking status: Current Every Day Smoker    Types: Cigars  . Smokeless tobacco: Never Used  Substance Use Topics  .  Alcohol use: No  . Drug use: No     Allergies   Patient has no known allergies.   Review of Systems Review of Systems  Constitutional: Negative for chills and fever.  HENT: Positive for congestion, sinus pressure and sinus pain. Negative for sore throat.   Respiratory: Positive for cough and wheezing. Negative for chest tightness and shortness of breath.   Cardiovascular: Negative for chest pain.  Gastrointestinal: Negative for abdominal pain, nausea and vomiting.     Physical Exam Updated Vital Signs BP (!) 142/77 (BP Location: Right Arm)   Pulse 78   Temp 98.5 F (36.9 C) (Oral)   Resp (!) 22   SpO2 98%   Physical Exam    Constitutional: He is oriented to person, place, and time. He appears well-developed and well-nourished. No distress.  HENT:  Head: Normocephalic and atraumatic.  Right Ear: Tympanic membrane, external ear and ear canal normal.  Left Ear: Tympanic membrane, external ear and ear canal normal.  Nose: Mucosal edema present. Right sinus exhibits maxillary sinus tenderness. Right sinus exhibits no frontal sinus tenderness. Left sinus exhibits maxillary sinus tenderness. Left sinus exhibits no frontal sinus tenderness.  Mouth/Throat: Uvula is midline, oropharynx is clear and moist and mucous membranes are normal. No tonsillar exudate.  Nasal mucosal edema.  OP clear without tonsillar swelling or exudate.  Uvula midline with equal palate rise.  TMs nonerythematous and not bulging bilaterally.  Mild tenderness to palpation of maxillary sinuses bilaterally.  Eyes: Conjunctivae and EOM are normal. Pupils are equal, round, and reactive to light.  Neck: Normal range of motion.  Cardiovascular: Normal rate, regular rhythm and intact distal pulses.  Pulmonary/Chest: Effort normal. He has no decreased breath sounds. He has wheezes. He has no rhonchi. He has no rales.  Pt speaking in full sentences without difficulty.  Mild expiratory wheezes of superior lungs bilaterally.  No rales or rhonchi.   Abdominal: Soft. He exhibits no distension and no mass. There is no tenderness. There is no guarding.  Musculoskeletal: Normal range of motion.  Lymphadenopathy:    He has no cervical adenopathy.  Neurological: He is alert and oriented to person, place, and time.  Skin: Skin is warm.  Psychiatric: He has a normal mood and affect.  Nursing note and vitals reviewed.    ED Treatments / Results  Labs (all labs ordered are listed, but only abnormal results are displayed) Labs Reviewed - No data to display  EKG  EKG Interpretation None       Radiology Dg Chest 2 View  Result Date: 01/15/2018 CLINICAL  DATA:  Cough and congestion. EXAM: CHEST  2 VIEW COMPARISON:  08/01/2017 FINDINGS: The cardiomediastinal silhouette is within normal limits. The lungs are well inflated and clear. There is no evidence of pleural effusion or pneumothorax. No acute osseous abnormality is identified. IMPRESSION: No active cardiopulmonary disease. Electronically Signed   By: Sebastian Ache M.D.   On: 01/15/2018 11:22    Procedures Procedures (including critical care time)  Medications Ordered in ED Medications  albuterol (PROVENTIL HFA;VENTOLIN HFA) 108 (90 Base) MCG/ACT inhaler 2 puff (2 puffs Inhalation Given 01/15/18 1213)  dexamethasone (DECADRON) injection 10 mg (10 mg Intramuscular Given 01/15/18 1213)     Initial Impression / Assessment and Plan / ED Course  I have reviewed the triage vital signs and the nursing notes.  Pertinent labs & imaging results that were available during my care of the patient were reviewed by me and considered in my medical  decision making (see chart for details).     Patient presenting with 2 day h/o URI symptoms.  Physical exam reassuring, patient is afebrile and appears nontoxic.  Pulmonary exam with mild wheezing. Doubt pneumonia, strep, other bacterial infection, or peritonsillar abscess. CXR negative. Likely viral URI.  Will give albuterol and Decadron here, and treat symptomatically at home.  Patient does not want Flonase, requesting allergy medication instead.  Patient to follow-up with primary care as needed.  At this time, patient appears safe for discharge.  Return precautions given.  Patient states he understands and agrees to plan.    Final Clinical Impressions(s) / ED Diagnoses   Final diagnoses:  Upper respiratory tract infection, unspecified type    ED Discharge Orders        Ordered    levocetirizine (XYZAL) 5 MG tablet  Every evening     01/15/18 1211    predniSONE (DELTASONE) 10 MG tablet  Daily     01/15/18 1211    promethazine-dextromethorphan  (PROMETHAZINE-DM) 6.25-15 MG/5ML syrup  4 times daily PRN     01/15/18 1211       Lorraine Cimmino, PA-C 01/15/18 1244    Azalia Bilis, MD 01/15/18 1654

## 2018-01-15 NOTE — Discharge Instructions (Signed)
You likely have a viral illness.  This should be treated symptomatically. Use Tylenol or ibuprofen as needed for fevers or body aches. Use xyzal daily for nasal congestion and cough. Take prednisone as prescribed. Use cough syrup as needed. Use the inhaler every 2-4 hours for the next day.  After this, use only as needed. Make sure you stay well-hydrated with water. Wash your hands frequently to prevent spread of infection. Follow-up with your primary care doctor in 1 week if your symptoms are not improving. Return to the emergency room if you develop chest pain, difficulty breathing, or any new or worsening symptoms.

## 2018-01-15 NOTE — ED Triage Notes (Signed)
Patient complains of 2 days of cold symptoms with cough and body aches and sinus pain, NAd

## 2018-01-15 NOTE — ED Notes (Signed)
Patient transported to X-ray 

## 2018-08-25 ENCOUNTER — Encounter (HOSPITAL_COMMUNITY): Payer: Self-pay | Admitting: Emergency Medicine

## 2018-08-25 ENCOUNTER — Other Ambulatory Visit: Payer: Self-pay

## 2018-08-25 ENCOUNTER — Emergency Department (HOSPITAL_COMMUNITY)
Admission: EM | Admit: 2018-08-25 | Discharge: 2018-08-25 | Disposition: A | Discharge status: Home / Self Care | Payer: Medicaid Other | Attending: Emergency Medicine | Admitting: Emergency Medicine

## 2018-08-25 ENCOUNTER — Emergency Department (HOSPITAL_COMMUNITY): Payer: Medicaid Other

## 2018-08-25 DIAGNOSIS — J45909 Unspecified asthma, uncomplicated: Secondary | ICD-10-CM | POA: Insufficient documentation

## 2018-08-25 DIAGNOSIS — Z79899 Other long term (current) drug therapy: Secondary | ICD-10-CM | POA: Diagnosis not present

## 2018-08-25 DIAGNOSIS — J4520 Mild intermittent asthma, uncomplicated: Secondary | ICD-10-CM | POA: Diagnosis not present

## 2018-08-25 DIAGNOSIS — F909 Attention-deficit hyperactivity disorder, unspecified type: Secondary | ICD-10-CM | POA: Insufficient documentation

## 2018-08-25 DIAGNOSIS — R062 Wheezing: Secondary | ICD-10-CM | POA: Diagnosis present

## 2018-08-25 DIAGNOSIS — F17210 Nicotine dependence, cigarettes, uncomplicated: Secondary | ICD-10-CM | POA: Diagnosis not present

## 2018-08-25 MED ORDER — BENZONATATE 100 MG PO CAPS: 100.0000 mg | ORAL_CAPSULE | Freq: Three times a day (TID) | ORAL | 0 refills | Status: DC

## 2018-08-25 MED ORDER — HYDROCODONE-ACETAMINOPHEN 5-325 MG PO TABS
1.0000 | ORAL_TABLET | Freq: Once | ORAL | Status: AC
  Administered 2018-08-25: 1 via ORAL
  Filled 2018-08-25: qty 1

## 2018-08-25 MED ORDER — PREDNISONE 50 MG PO TABS: ORAL_TABLET | ORAL | 0 refills | Status: DC

## 2018-08-25 MED ORDER — ALBUTEROL SULFATE HFA 108 (90 BASE) MCG/ACT IN AERS
2.0000 | INHALATION_SPRAY | RESPIRATORY_TRACT | Status: DC | PRN
  Administered 2018-08-25: 2 via RESPIRATORY_TRACT
  Filled 2018-08-25: qty 6.7

## 2018-08-25 MED ORDER — IPRATROPIUM-ALBUTEROL 0.5-2.5 (3) MG/3ML IN SOLN
3.0000 mL | Freq: Once | RESPIRATORY_TRACT | Status: AC
  Administered 2018-08-25: 3 mL via RESPIRATORY_TRACT
  Filled 2018-08-25: qty 3

## 2018-08-25 MED ORDER — ALBUTEROL SULFATE (2.5 MG/3ML) 0.083% IN NEBU
5.0000 mg | INHALATION_SOLUTION | Freq: Once | RESPIRATORY_TRACT | Status: AC
  Administered 2018-08-25: 5 mg via RESPIRATORY_TRACT
  Filled 2018-08-25: qty 6

## 2018-08-25 MED ORDER — AZITHROMYCIN 250 MG PO TABS: ORAL_TABLET | ORAL | 0 refills | Status: DC

## 2018-08-25 NOTE — ED Triage Notes (Signed)
Pt states he has nasal congestion, cough with some shortness of breath. States he has asthma and is out of his inhaler.

## 2018-08-25 NOTE — ED Notes (Signed)
No improvement to lung sounds post breathing tx. Pt still has wheezing throughout.

## 2018-08-25 NOTE — ED Provider Notes (Signed)
[facility] EMERGENCY DEPARTMENT Provider Note   CSN: [id number] Arrival date & time: 08/25/18  1446     History   Chief Complaint Chief Complaint  Patient presents with  . URI  . Asthma    HPI Patrick James is a 27 y.o. male  Every day smoker who presents to the ED with nasal congestion, cough and wheezing. Patient reports hx of asthma and is out of his inhaler. Patient reports that he has had itchy watery eyes. He thought at first it was just a cold but now it is worse. He reports taking OTC cough medication without relief.   HPI  Past Medical History:  Diagnosis Date  . ADHD (attention deficit hyperactivity disorder)   . ADHD (attention deficit hyperactivity disorder)   . Asthma   . Bipolar 1 disorder (HCC)   . Depression   . Eczema   . Scabies     There are no active problems to display for this patient.   Past Surgical History:  Procedure Laterality Date  . EYE SURGERY          Home Medications    Prior to Admission medications   Medication Sig Start Date End Date Taking? Authorizing Provider  Ascorbic Acid (VITAMIN C) 1000 MG tablet Take 1,000 mg by mouth daily.    [provider]  azithromycin (ZITHROMAX Z-PAK) 250 MG tablet Take the first 2 tablets now and then one tablet PO daily 08/25/18   Janne Napoleon, NP  benzonatate (TESSALON) 100 MG capsule Take 1 capsule (100 mg total) by mouth every 8 (eight) hours. 08/25/18   Janne Napoleon, NP  carbamide peroxide (DEBROX) 6.5 % OTIC solution Place 5 drops into the left ear 2 (two) times daily. 08/01/17   Felicie Morn, NP  cholecalciferol (VITAMIN D) 1000 units tablet Take 1,000 Units by mouth daily.    [provider]  ibuprofen (ADVIL,MOTRIN) 800 MG tablet Take 1 tablet (800 mg total) by mouth 3 (three) times daily. 04/02/16   Gilda Crease, MD  levocetirizine (XYZAL) 5 MG tablet Take 1 tablet (5 mg total) by mouth every evening. 01/15/18   Caccavale, Sophia, PA-C    meloxicam (MOBIC) 15 MG tablet Take 1 tablet (15 mg total) by mouth daily. Patient not taking: Reported on 01/20/2016 03/24/15   Emilia Beck, PA-C  PARoxetine (PAXIL) 20 MG tablet Take 20 mg by mouth daily.    [provider]  predniSONE (DELTASONE) 50 MG tablet Take one tablet PO daily 08/25/18   Janne Napoleon, NP  promethazine-dextromethorphan (PROMETHAZINE-DM) 6.25-15 MG/5ML syrup Take 5 mLs by mouth 4 (four) times daily as needed. 01/15/18   Caccavale, Sophia, PA-C  traMADol (ULTRAM) 50 MG tablet Take 1 tablet (50 mg total) by mouth every 6 (six) hours as needed. 04/02/16   Gilda Crease, MD  traZODone (DESYREL) 50 MG tablet Take 50 mg by mouth at bedtime. 04/11/15   [provider]  VYVANSE 40 MG capsule Take 40 mg by mouth every morning. 06/17/15   [provider]    Family History Family History  Problem Relation Age of Onset  . Hypertension Other     Social History Social History   Tobacco Use  . Smoking status: Current Every Day Smoker    Types: Cigars  . Smokeless tobacco: Never Used  Substance Use Topics  . Alcohol use: No  . Drug use: No     Allergies   Patient has no known  allergies.   Review of Systems Review of Systems  Respiratory: Positive for cough, shortness of breath and wheezing.   All other systems reviewed and are negative.    Physical Exam Updated Vital Signs BP 118/64   Pulse 80   Temp 98.9 F (37.2 C) (Oral)   Resp 16   SpO2 100%   Physical Exam  Constitutional: He appears well-developed and well-nourished. No distress.  HENT:  Head: Normocephalic.  Eyes: EOM are normal.  Neck: Neck supple.  Cardiovascular: Normal rate and regular rhythm.  Pulmonary/Chest: Effort normal. He has decreased breath sounds. He has wheezes.  Musculoskeletal: Normal range of motion.  Neurological: He is alert.  Skin: Skin is warm and dry.  Nursing note and vitals reviewed.    ED Treatments / Results  Labs (all  labs ordered are listed, but only abnormal results are displayed) Labs Reviewed - No data to display  Radiology Dg Chest 2 View  Result Date: 08/25/2018 CLINICAL DATA:  Dry and productive cough and shortness of breath for the past 2 days. Smoker. EXAM: CHEST - 2 VIEW COMPARISON:  01/15/2018. FINDINGS: Normal sized heart. Clear lungs. Mild-to-moderate central peribronchial thickening. Mild lower thoracic spine degenerative changes. IMPRESSION: Mild to moderate bronchitic changes. Electronically Signed   By: Beckie SaltsSteven  Reid M.D.   On: 08/25/2018 17:48    Procedures Procedures (including critical care time)  Medications Ordered in ED Medications  albuterol (PROVENTIL) (2.5 MG/3ML) 0.083% nebulizer solution 5 mg (5 mg Nebulization Given 08/25/18 1524)  ipratropium-albuterol (DUONEB) 0.5-2.5 (3) MG/3ML nebulizer solution 3 mL (3 mLs Nebulization Given 08/25/18 1633)  HYDROcodone-acetaminophen (NORCO/VICODIN) 5-325 MG per tablet 1 tablet (1 tablet Oral Given 08/25/18 1841)   Patient improved with neb treatments. Stable for d/c with 02 SAT 100% on R/A. Albuterol inhaler given prior to d/c. Will treat with steroids, z-pak and Tessalon. Encouraged patient to stop smoking.   Initial Impression / Assessment and Plan / ED Course  I have reviewed the triage vital signs and the nursing notes.   Final Clinical Impressions(s) / ED Diagnoses   Final diagnoses:  Mild intermittent asthmatic bronchitis without complication    ED Discharge Orders         Ordered    azithromycin (ZITHROMAX Z-PAK) 250 MG tablet     08/25/18 1824    benzonatate (TESSALON) 100 MG capsule  Every 8 hours     08/25/18 1824    predniSONE (DELTASONE) 50 MG tablet     08/25/18 [address]1824           Hjalmar Ballengee, ShelbyvilleHope M, NP 08/26/18 0037    Alvira MondaySchlossman, Erin, MD 08/26/18 [phone]

## 2018-08-25 NOTE — Discharge Instructions (Signed)
Follow up with your doctor, return here as needed.  °

## 2019-08-03 ENCOUNTER — Emergency Department (HOSPITAL_COMMUNITY)
Admission: EM | Admit: 2019-08-03 | Discharge: 2019-08-03 | Disposition: A | Discharge status: Home / Self Care | Payer: Medicaid Other | Attending: Emergency Medicine | Admitting: Emergency Medicine

## 2019-08-03 ENCOUNTER — Encounter (HOSPITAL_COMMUNITY): Payer: Self-pay | Admitting: *Deleted

## 2019-08-03 ENCOUNTER — Other Ambulatory Visit: Payer: Self-pay

## 2019-08-03 DIAGNOSIS — R21 Rash and other nonspecific skin eruption: Secondary | ICD-10-CM | POA: Diagnosis present

## 2019-08-03 DIAGNOSIS — Z79899 Other long term (current) drug therapy: Secondary | ICD-10-CM | POA: Diagnosis not present

## 2019-08-03 DIAGNOSIS — J45909 Unspecified asthma, uncomplicated: Secondary | ICD-10-CM | POA: Insufficient documentation

## 2019-08-03 DIAGNOSIS — F17290 Nicotine dependence, other tobacco product, uncomplicated: Secondary | ICD-10-CM | POA: Insufficient documentation

## 2019-08-03 DIAGNOSIS — B356 Tinea cruris: Secondary | ICD-10-CM | POA: Diagnosis not present

## 2019-08-03 MED ORDER — ALBUTEROL SULFATE HFA 108 (90 BASE) MCG/ACT IN AERS: 1.0000 | INHALATION_SPRAY | Freq: Four times a day (QID) | RESPIRATORY_TRACT | 1 refills | Status: DC | PRN

## 2019-08-03 MED ORDER — CLOTRIMAZOLE 1 % EX CREA: TOPICAL_CREAM | CUTANEOUS | 0 refills | Status: DC

## 2019-08-03 NOTE — ED Triage Notes (Signed)
PT states he has a rash that burns and itches around his testicles.  Denies urinary s/s or discharge from his penis.

## 2019-08-03 NOTE — ED Provider Notes (Signed)
[facility] Provider Note   CSN: [id number] Arrival date & time: 08/03/19  1530     History   Chief Complaint Chief Complaint  Patient presents with  . Rash    HPI Patrick James is a 28 y.o. male past medical history of ADHD, asthma, depression who presents for evaluation of rash to groin that is been ongoing for last 2 weeks.  He states that initially, it started in his bilateral inguinal folds and has since progressed.  He states it is pruritic and painful.  He tried over-the-counter [employer] but states that he did not have any improvement and states it is gotten worse.  He states he has not noticed any testicular pain or swelling or penile discharge.  He is currently sexually active with one partner.  He denies any protection.  He has not had any fevers, dysuria, hematuria.     The history is provided by the patient.    Past Medical History:  Diagnosis Date  . ADHD (attention deficit hyperactivity disorder)   . ADHD (attention deficit hyperactivity disorder)   . Asthma   . Bipolar 1 disorder (HCC)   . Depression   . Eczema   . Scabies     There are no active problems to display for this patient.   Past Surgical History:  Procedure Laterality Date  . EYE SURGERY          Home Medications    Prior to Admission medications   Medication Sig Start Date End Date Taking? Authorizing Provider  albuterol (VENTOLIN HFA) 108 (90 Base) MCG/ACT inhaler Inhale 1-2 puffs into the lungs every 6 (six) hours as needed for wheezing or shortness of breath. 08/03/19   Maxwell CaulLayden, Jaryah Aracena A, PA-C  Ascorbic Acid (VITAMIN C) 1000 MG tablet Take 1,000 mg by mouth daily.    [provider]  azithromycin (ZITHROMAX Z-PAK) 250 MG tablet Take the first 2 tablets now and then one tablet PO daily 08/25/18   Janne NapoleonNeese, Hope M, NP  benzonatate (TESSALON) 100 MG capsule Take 1 capsule (100 mg total) by mouth every 8 (eight) hours. 08/25/18   Janne NapoleonNeese, Hope  M, NP  carbamide peroxide (DEBROX) 6.5 % OTIC solution Place 5 drops into the left ear 2 (two) times daily. 08/01/17   Felicie MornSmith, David, NP  cholecalciferol (VITAMIN D) 1000 units tablet Take 1,000 Units by mouth daily.    [provider]  clotrimazole (LOTRIMIN) 1 % cream Apply to affected area 2 times daily 08/03/19   Graciella FreerLayden, Jack Bolio A, PA-C  ibuprofen (ADVIL,MOTRIN) 800 MG tablet Take 1 tablet (800 mg total) by mouth 3 (three) times daily. 04/02/16   Gilda CreasePollina, Christopher J, MD  levocetirizine (XYZAL) 5 MG tablet Take 1 tablet (5 mg total) by mouth every evening. 01/15/18   Caccavale, Sophia, PA-C  meloxicam (MOBIC) 15 MG tablet Take 1 tablet (15 mg total) by mouth daily. Patient not taking: Reported on 01/20/2016 03/24/15   Emilia BeckSzekalski, Kaitlyn, PA-C  PARoxetine (PAXIL) 20 MG tablet Take 20 mg by mouth daily.    [provider]  predniSONE (DELTASONE) 50 MG tablet Take one tablet PO daily 08/25/18   Janne NapoleonNeese, Hope M, NP  promethazine-dextromethorphan (PROMETHAZINE-DM) 6.25-15 MG/5ML syrup Take 5 mLs by mouth 4 (four) times daily as needed. 01/15/18   Caccavale, Sophia, PA-C  traMADol (ULTRAM) 50 MG tablet Take 1 tablet (50 mg total) by mouth every 6 (six) hours as needed. 04/02/16   Gilda CreasePollina, Christopher J, MD  traZODone (  DESYREL) 50 MG tablet Take 50 mg by mouth at bedtime. 04/11/15   [provider]  VYVANSE 40 MG capsule Take 40 mg by mouth every morning. 06/17/15   [provider]    Family History Family History  Problem Relation Age of Onset  . Hypertension Other     Social History Social History   Tobacco Use  . Smoking status: Current Every Day Smoker    Types: Cigars  . Smokeless tobacco: Never Used  Substance Use Topics  . Alcohol use: No  . Drug use: No     Allergies   Patient has no known allergies.   Review of Systems Review of Systems  Constitutional: Negative for fever.  Genitourinary: Negative for dysuria and hematuria.  Skin: Negative for  rash.  All other systems reviewed and are negative.    Physical Exam Updated Vital Signs BP 133/72 (BP Location: Right Arm)   Pulse 66   Temp 98.1 F (36.7 C) (Oral)   Resp 18   Ht 6\' 1"  (1.854 m)   Wt 136.1 kg   SpO2 97%   BMI 39.58 kg/m   Physical Exam Vitals signs and nursing note reviewed. Exam conducted with a chaperone present.  Constitutional:      Appearance: He is well-developed.  HENT:     Head: Normocephalic and atraumatic.  Eyes:     General: No scleral icterus.       Right eye: No discharge.        Left eye: No discharge.     Conjunctiva/sclera: Conjunctivae normal.  Pulmonary:     Effort: Pulmonary effort is normal.  Genitourinary:    Comments: The exam was performed with a chaperone present.  Confluent erythematous, dry scaly patches noted to bilateral inguinal folds and extends up to the lower abdomen skin fold with some scattered small erythematous patches that extend out.  No testicular tenderness, erythema, warmth noted bilaterally.  No tenderness palpation of the penis. No vesicles.  Skin:    General: Skin is warm and dry.  Neurological:     Mental Status: He is alert.  Psychiatric:        Speech: Speech normal.        Behavior: Behavior normal.      ED Treatments / Results  Labs (all labs ordered are listed, but only abnormal results are displayed) Labs Reviewed - No data to display  EKG None  Radiology No results found.  Procedures Procedures (including critical care time)  Medications Ordered in ED Medications - No data to display   Initial Impression / Assessment and Plan / ED Course  I have reviewed the triage vital signs and the nursing notes.  Pertinent labs & imaging results that were available during my care of the patient were reviewed by me and considered in my medical decision making (see chart for details).        28 year old male who presents for evaluation of rash to groin x2 weeks.  No fevers, urinary  complaints. Patient is afebrile, non-toxic appearing, sitting comfortably on examination table. Vital signs reviewed and stable.  On exam, his symptoms are assistant with tinea cruris.  Exam not concerning for herpes.  He has no testicular pain or tenderness.  Do not suspect tendinitis.  We will plan to treat as tinea cruris. At this time, patient exhibits no emergent life-threatening condition that require further evaluation in ED or admission. Patient had ample opportunity for questions and discussion. All patient's questions were answered  with full understanding. Strict return precautions discussed. Patient expresses understanding and agreement to plan.   Portions of this note were generated with Scientist, clinical (histocompatibility and immunogenetics). Dictation errors may occur despite best attempts at proofreading.  Final Clinical Impressions(s) / ED Diagnoses   Final diagnoses:  Tinea cruris    ED Discharge Orders         Ordered    clotrimazole (LOTRIMIN) 1 % cream     08/03/19 1636    albuterol (VENTOLIN HFA) 108 (90 Base) MCG/ACT inhaler  Every 6 hours PRN     08/03/19 1636           Maxwell Caul, PA-C 08/03/19 Brooks Sailors, MD 08/05/19 1323

## 2019-08-03 NOTE — Discharge Instructions (Signed)
As we discussed, your symptoms are consistent with a fungal infection.  Use the cream as directed.  As we discussed, you should keep the area dry and clean.  Wear loose fitting clothes that are not tight up against the skin.  Follow-up with [facility] to establish a primary care doctor if you do not have one.   Return the emergency department for any fever, worsening rash, pain or swelling of the testicles or any other worsening or concerning symptoms.

## 2019-10-24 ENCOUNTER — Emergency Department (HOSPITAL_COMMUNITY)
Admission: EM | Admit: 2019-10-24 | Discharge: 2019-10-24 | Disposition: A | Discharge status: Home / Self Care | Payer: Medicaid Other | Attending: Emergency Medicine | Admitting: Emergency Medicine

## 2019-10-24 ENCOUNTER — Other Ambulatory Visit: Payer: Self-pay

## 2019-10-24 DIAGNOSIS — Z79899 Other long term (current) drug therapy: Secondary | ICD-10-CM | POA: Insufficient documentation

## 2019-10-24 DIAGNOSIS — F17210 Nicotine dependence, cigarettes, uncomplicated: Secondary | ICD-10-CM | POA: Insufficient documentation

## 2019-10-24 DIAGNOSIS — J45909 Unspecified asthma, uncomplicated: Secondary | ICD-10-CM | POA: Insufficient documentation

## 2019-10-24 DIAGNOSIS — K0889 Other specified disorders of teeth and supporting structures: Secondary | ICD-10-CM | POA: Diagnosis present

## 2019-10-24 DIAGNOSIS — K029 Dental caries, unspecified: Secondary | ICD-10-CM | POA: Diagnosis not present

## 2019-10-24 MED ORDER — HYDROCODONE-ACETAMINOPHEN 5-325 MG PO TABS
1.0000 | ORAL_TABLET | Freq: Once | ORAL | Status: AC
  Administered 2019-10-24: 1 via ORAL
  Filled 2019-10-24: qty 1

## 2019-10-24 MED ORDER — PENICILLIN V POTASSIUM 500 MG PO TABS: 500.0000 mg | ORAL_TABLET | Freq: Four times a day (QID) | ORAL | 0 refills | Status: AC

## 2019-10-24 NOTE — Discharge Instructions (Signed)
Please read instructions below. Take the antibiotic, Penicillin V, 4 times per day until they are gone. Continue taking aleve up to 2 times per day with meals, as needed for pain. You can take tylenol in addition to this as needed for additional pain relief. Schedule an appointment with a dentist, using the dental resource guide attached. Return to the ER for difficulty swallowing or breathing, fever, or new or worsening symptoms.

## 2019-10-24 NOTE — ED Provider Notes (Signed)
[facility]   CSN: [id number] Arrival date & time: 10/24/19  1508     History   Chief Complaint Chief Complaint  Patient presents with  . Dental Pain    HPI Patrick James is a 28 y.o. male presenting to the emergency department with 1 week of right upper dental pain.  He states pain is exacerbated by cold air.  He states he knows he has a cavity and needs to see a dentist, however he has tried multiple dentists in the area and they are not accepting Medicaid.  He denies fevers or difficulty swallowing.  He has been treating his symptoms with Aleve with minimal relief.  He smokes Black and milds.     The history is provided by the patient.    Past Medical History:  Diagnosis Date  . ADHD (attention deficit hyperactivity disorder)   . ADHD (attention deficit hyperactivity disorder)   . Asthma   . Bipolar 1 disorder (HCC)   . Depression   . Eczema   . Scabies     There are no active problems to display for this patient.   Past Surgical History:  Procedure Laterality Date  . EYE SURGERY          Home Medications    Prior to Admission medications   Medication Sig Start Date End Date Taking? Authorizing Provider  albuterol (VENTOLIN HFA) 108 (90 Base) MCG/ACT inhaler Inhale 1-2 puffs into the lungs every 6 (six) hours as needed for wheezing or shortness of breath. 08/03/19   Maxwell CaulLayden, Lindsey A, PA-C  Ascorbic Acid (VITAMIN C) 1000 MG tablet Take 1,000 mg by mouth daily.    [provider]  azithromycin (ZITHROMAX Z-PAK) 250 MG tablet Take the first 2 tablets now and then one tablet PO daily 08/25/18   Janne NapoleonNeese, Hope M, NP  benzonatate (TESSALON) 100 MG capsule Take 1 capsule (100 mg total) by mouth every 8 (eight) hours. 08/25/18   Janne NapoleonNeese, Hope M, NP  carbamide peroxide (DEBROX) 6.5 % OTIC solution Place 5 drops into the left ear 2 (two) times daily. 08/01/17   Felicie MornSmith, David, NP  cholecalciferol (VITAMIN D)  1000 units tablet Take 1,000 Units by mouth daily.    [provider]  clotrimazole (LOTRIMIN) 1 % cream Apply to affected area 2 times daily 08/03/19   Graciella FreerLayden, Lindsey A, PA-C  ibuprofen (ADVIL,MOTRIN) 800 MG tablet Take 1 tablet (800 mg total) by mouth 3 (three) times daily. 04/02/16   Gilda CreasePollina, Christopher J, MD  levocetirizine (XYZAL) 5 MG tablet Take 1 tablet (5 mg total) by mouth every evening. 01/15/18   Caccavale, Sophia, PA-C  meloxicam (MOBIC) 15 MG tablet Take 1 tablet (15 mg total) by mouth daily. Patient not taking: Reported on 01/20/2016 03/24/15   Emilia BeckSzekalski, Kaitlyn, PA-C  PARoxetine (PAXIL) 20 MG tablet Take 20 mg by mouth daily.    [provider]  penicillin v potassium (VEETID) 500 MG tablet Take 1 tablet (500 mg total) by mouth 4 (four) times daily for 7 days. 10/24/19 10/31/19  Shateria Paternostro, [place] N, PA-C  predniSONE (DELTASONE) 50 MG tablet Take one tablet PO daily 08/25/18   Janne NapoleonNeese, Hope M, NP  promethazine-dextromethorphan (PROMETHAZINE-DM) 6.25-15 MG/5ML syrup Take 5 mLs by mouth 4 (four) times daily as needed. 01/15/18   Caccavale, Sophia, PA-C  traMADol (ULTRAM) 50 MG tablet Take 1 tablet (50 mg total) by mouth every 6 (six) hours as needed. 04/02/16   Gilda CreasePollina, Christopher J,  MD  traZODone (DESYREL) 50 MG tablet Take 50 mg by mouth at bedtime. 04/11/15   [provider]  VYVANSE 40 MG capsule Take 40 mg by mouth every morning. 06/17/15   [provider]    Family History Family History  Problem Relation Age of Onset  . Hypertension Other     Social History Social History   Tobacco Use  . Smoking status: Current Every Day Smoker    Types: Cigars  . Smokeless tobacco: Never Used  Substance Use Topics  . Alcohol use: No  . Drug use: No     Allergies   Patient has no known allergies.   Review of Systems Review of Systems  Constitutional: Negative for fever.  HENT: Positive for dental problem.      Physical Exam Updated Vital  Signs BP 133/83 (BP Location: Right Arm)   Pulse 75   Temp 98.1 F (36.7 C) (Oral)   Resp 16   SpO2 98%   Physical Exam Vitals signs and nursing note reviewed.  Constitutional:      General: He is not in acute distress.    Appearance: He is well-developed. He is obese.  HENT:     Head: Normocephalic and atraumatic.     Mouth/Throat:     Comments: Poor dentition throughout.  Right upper first molar with obvious dental carry and tenderness.  Mild surrounding gingival erythema, no fluctuance.  Uvula is midline, tolerating secretions. Eyes:     Conjunctiva/sclera: Conjunctivae normal.  Neck:     Musculoskeletal: Normal range of motion and neck supple. No muscular tenderness.  Cardiovascular:     Rate and Rhythm: Normal rate.  Pulmonary:     Effort: Pulmonary effort is normal.     Breath sounds: No stridor.  Lymphadenopathy:     Cervical: No cervical adenopathy.  Neurological:     Mental Status: He is alert.  Psychiatric:        Mood and Affect: Mood normal.        Behavior: Behavior normal.      ED Treatments / Results  Labs (all labs ordered are listed, but only abnormal results are displayed) Labs Reviewed - No data to display  EKG None  Radiology No results found.  Procedures Procedures (including critical care time)  Medications Ordered in ED Medications  HYDROcodone-acetaminophen (NORCO/VICODIN) 5-325 MG per tablet 1 tablet (has no administration in time range)     Initial Impression / Assessment and Plan / ED Course  I have reviewed the triage vital signs and the nursing notes.  Pertinent labs & imaging results that were available during my care of the patient were reviewed by me and considered in my medical decision making (see chart for details).        Patient with dental caries.  No gross abscess.  VSS, afebrile, tolerating secretions. Exam unconcerning for peritonsillar abscess, Ludwig's angina or spread of infection.  Will treat with  penicillin and pain medicine.  Offered dental block, however pt preferred oral medication for pain in the ED. Dental resource guide provided for outpt follow up. Pt safe for discharge.   Final Clinical Impressions(s) / ED Diagnoses   Final diagnoses:  Pain due to dental caries    ED Discharge Orders         Ordered    penicillin v potassium (VEETID) 500 MG tablet  4 times daily     10/24/19 1623           Roxan Hockey, [place]  N, PA-C 10/24/19 1627    Ezequiel Essex, MD 10/24/19 Greer Ee

## 2019-10-24 NOTE — ED Triage Notes (Signed)
Pt here for evaluation of dental pain to R upper side. Pt sts he thinks he has an abscess beside his R upper canine tooth and one of his molars is chipped.

## 2019-10-24 NOTE — ED Notes (Signed)
Patient verbalizes understanding of discharge instructions. Opportunity for questioning and answers were provided. Armband removed by staff, pt discharged from ED.  

## 2020-02-06 ENCOUNTER — Encounter (HOSPITAL_COMMUNITY): Payer: Self-pay | Admitting: *Deleted

## 2020-02-06 ENCOUNTER — Emergency Department (HOSPITAL_COMMUNITY)
Admission: EM | Admit: 2020-02-06 | Discharge: 2020-02-06 | Disposition: A | Discharge status: Home / Self Care | Payer: Medicaid Other | Attending: Emergency Medicine | Admitting: Emergency Medicine

## 2020-02-06 ENCOUNTER — Other Ambulatory Visit: Payer: Self-pay

## 2020-02-06 DIAGNOSIS — Y999 Unspecified external cause status: Secondary | ICD-10-CM | POA: Insufficient documentation

## 2020-02-06 DIAGNOSIS — Y929 Unspecified place or not applicable: Secondary | ICD-10-CM | POA: Insufficient documentation

## 2020-02-06 DIAGNOSIS — Z5321 Procedure and treatment not carried out due to patient leaving prior to being seen by health care provider: Secondary | ICD-10-CM | POA: Diagnosis not present

## 2020-02-06 DIAGNOSIS — Y9389 Activity, other specified: Secondary | ICD-10-CM | POA: Insufficient documentation

## 2020-02-06 DIAGNOSIS — S0990XA Unspecified injury of head, initial encounter: Secondary | ICD-10-CM | POA: Diagnosis present

## 2020-02-06 DIAGNOSIS — S0001XA Abrasion of scalp, initial encounter: Secondary | ICD-10-CM | POA: Insufficient documentation

## 2020-02-06 NOTE — ED Notes (Signed)
Pt has not been inside the lobby for quite some time and is not outside
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
## 2020-02-09 ENCOUNTER — Emergency Department (HOSPITAL_COMMUNITY)
Admission: EM | Admit: 2020-02-09 | Discharge: 2020-02-09 | Disposition: A | Discharge status: Home / Self Care | Payer: Medicaid Other | Attending: Emergency Medicine | Admitting: Emergency Medicine

## 2020-02-09 ENCOUNTER — Other Ambulatory Visit: Payer: Self-pay

## 2020-02-09 ENCOUNTER — Encounter (HOSPITAL_COMMUNITY): Payer: Self-pay | Admitting: *Deleted

## 2020-02-09 DIAGNOSIS — Z5321 Procedure and treatment not carried out due to patient leaving prior to being seen by health care provider: Secondary | ICD-10-CM | POA: Insufficient documentation

## 2020-02-09 DIAGNOSIS — R519 Headache, unspecified: Secondary | ICD-10-CM | POA: Diagnosis not present

## 2020-02-09 NOTE — ED Notes (Signed)
Per tech, she saw pt leave
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
## 2020-04-23 ENCOUNTER — Emergency Department (HOSPITAL_COMMUNITY)
Admission: EM | Admit: 2020-04-23 | Discharge: 2020-04-24 | Disposition: A | Discharge status: Home / Self Care | Payer: Medicaid Other | Attending: Emergency Medicine | Admitting: Emergency Medicine

## 2020-04-23 ENCOUNTER — Encounter (HOSPITAL_COMMUNITY): Payer: Self-pay | Admitting: *Deleted

## 2020-04-23 ENCOUNTER — Other Ambulatory Visit: Payer: Self-pay

## 2020-04-23 DIAGNOSIS — F909 Attention-deficit hyperactivity disorder, unspecified type: Secondary | ICD-10-CM | POA: Insufficient documentation

## 2020-04-23 DIAGNOSIS — Z79899 Other long term (current) drug therapy: Secondary | ICD-10-CM | POA: Diagnosis not present

## 2020-04-23 DIAGNOSIS — F17210 Nicotine dependence, cigarettes, uncomplicated: Secondary | ICD-10-CM | POA: Insufficient documentation

## 2020-04-23 DIAGNOSIS — R251 Tremor, unspecified: Secondary | ICD-10-CM | POA: Diagnosis present

## 2020-04-23 DIAGNOSIS — W208XXA Other cause of strike by thrown, projected or falling object, initial encounter: Secondary | ICD-10-CM | POA: Diagnosis not present

## 2020-04-23 DIAGNOSIS — M546 Pain in thoracic spine: Secondary | ICD-10-CM | POA: Diagnosis not present

## 2020-04-23 NOTE — ED Triage Notes (Signed)
Pt says that he had tremors in the right arm yesterday and today that lasted 5-6 minutes. Also feels like he pulled a muscle in his left calf and back while working yesterday. Pt ambulatory to triage with steady gate.

## 2020-04-24 ENCOUNTER — Emergency Department (HOSPITAL_COMMUNITY): Payer: Medicaid Other

## 2020-04-24 LAB — CBC
HCT: 46.9 % (ref 39.0–52.0)
Hemoglobin: 15.5 g/dL (ref 13.0–17.0)
MCH: 30.5 pg (ref 26.0–34.0)
MCHC: 33 g/dL (ref 30.0–36.0)
MCV: 92.1 fL (ref 80.0–100.0)
Platelets: 181 10*3/uL (ref 150–400)
RBC: 5.09 MIL/uL (ref 4.22–5.81)
RDW: 12.3 % (ref 11.5–15.5)
WBC: 9.9 10*3/uL (ref 4.0–10.5)
nRBC: 0 % (ref 0.0–0.2)

## 2020-04-24 LAB — BASIC METABOLIC PANEL
Anion gap: 12 (ref 5–15)
BUN: 13 mg/dL (ref 6–20)
CO2: 24 mmol/L (ref 22–32)
Calcium: 9.4 mg/dL (ref 8.9–10.3)
Chloride: 102 mmol/L (ref 98–111)
Creatinine, Ser: 1.02 mg/dL (ref 0.61–1.24)
GFR calc Af Amer: >60 mL/min (ref >60)
GFR calc non Af Amer: >60 mL/min (ref >60)
Glucose, Bld: 87 mg/dL (ref 70–99)
Potassium: 3.8 mmol/L (ref 3.5–5.1)
Sodium: 138 mmol/L (ref 135–145)

## 2020-04-24 MED ORDER — METHOCARBAMOL 500 MG PO TABS
500.0000 mg | ORAL_TABLET | Freq: Two times a day (BID) | ORAL | 0 refills | Status: DC
Start: 2020-04-24 — End: 2021-03-03

## 2020-04-24 MED ORDER — NAPROXEN 500 MG PO TABS: 500.0000 mg | ORAL_TABLET | Freq: Two times a day (BID) | ORAL | 0 refills | Status: DC

## 2020-04-24 MED ORDER — IBUPROFEN 800 MG PO TABS
800.0000 mg | ORAL_TABLET | Freq: Once | ORAL | Status: AC
  Administered 2020-04-24: 800 mg via ORAL
  Filled 2020-04-24: qty 1

## 2020-04-24 NOTE — Discharge Instructions (Addendum)

## 2020-04-24 NOTE — ED Provider Notes (Signed)
[facility] EMERGENCY DEPARTMENT Provider Note   CSN: [id number] Arrival date & time: 04/23/20  2315     History Chief Complaint  Patient presents with  . Tremors    Patrick James is a 29 y.o. male with a hx of ADHD presents to the Emergency Department complaining of acute, persistent, progressively worsening mid back pain onset around midday yesterday when a pile of boxes fell on him.  Patient reports one struck him in the leg and a second struck him in the back.  He reports he did fall but did not hit his head or have a loss of consciousness.  No treatments prior to arrival.  Movement and palpation make his symptoms worse.    Additionally, patient complains of one episode of tremors in his right arm on Thursday.  He reports it lasted 5-6 minutes and resolved spontaneously.  Patient reports it has not returned since that time.  Patient denies dehydration, numbness or weakness in the hand, rash, neck pain.    He reports that he does not think he can go to work tomorrow and is here for work note.  The history is provided by the patient and medical records. No language interpreter was used.       Past Medical History:  Diagnosis Date  . ADHD (attention deficit hyperactivity disorder)   . ADHD (attention deficit hyperactivity disorder)   . Asthma   . Bipolar 1 disorder (HCC)   . Depression   . Eczema   . Scabies     There are no problems to display for this patient.   Past Surgical History:  Procedure Laterality Date  . EYE SURGERY         Family History  Problem Relation Age of Onset  . Hypertension Other     Social History   Tobacco Use  . Smoking status: Current Every Day Smoker    Types: Cigars  . Smokeless tobacco: Never Used  Substance Use Topics  . Alcohol use: No  . Drug use: No    Home Medications Prior to Admission medications   Medication Sig Start Date End Date Taking? Authorizing Provider  albuterol (VENTOLIN HFA) 108  (90 Base) MCG/ACT inhaler Inhale 1-2 puffs into the lungs every 6 (six) hours as needed for wheezing or shortness of breath. 08/03/19   Maxwell Caul, PA-C  Ascorbic Acid (VITAMIN C) 1000 MG tablet Take 1,000 mg by mouth daily.    [provider]  azithromycin (ZITHROMAX Z-PAK) 250 MG tablet Take the first 2 tablets now and then one tablet PO daily 08/25/18   Janne Napoleon, NP  benzonatate (TESSALON) 100 MG capsule Take 1 capsule (100 mg total) by mouth every 8 (eight) hours. 08/25/18   Janne Napoleon, NP  carbamide peroxide (DEBROX) 6.5 % OTIC solution Place 5 drops into the left ear 2 (two) times daily. 08/01/17   Felicie Morn, NP  cholecalciferol (VITAMIN D) 1000 units tablet Take 1,000 Units by mouth daily.    [provider]  clotrimazole (LOTRIMIN) 1 % cream Apply to affected area 2 times daily 08/03/19   Graciella Freer A, PA-C  ibuprofen (ADVIL,MOTRIN) 800 MG tablet Take 1 tablet (800 mg total) by mouth 3 (three) times daily. 04/02/16   Gilda Crease, MD  levocetirizine (XYZAL) 5 MG tablet Take 1 tablet (5 mg total) by mouth every evening. 01/15/18   Caccavale, Sophia, PA-C  meloxicam (MOBIC) 15 MG tablet Take 1 tablet (15 mg total)  by mouth daily. Patient not taking: Reported on 01/20/2016 03/24/15   Alvina Chou, PA-C  methocarbamol (ROBAXIN) 500 MG tablet Take 1 tablet (500 mg total) by mouth 2 (two) times daily. 04/24/20   Minie Roadcap, Jarrett Soho, PA-C  naproxen (NAPROSYN) 500 MG tablet Take 1 tablet (500 mg total) by mouth 2 (two) times daily with a meal. 04/24/20   Keirah Konitzer, Jarrett Soho, PA-C  PARoxetine (PAXIL) 20 MG tablet Take 20 mg by mouth daily.    [provider]  predniSONE (DELTASONE) 50 MG tablet Take one tablet PO daily 08/25/18   Ashley Murrain, NP  promethazine-dextromethorphan (PROMETHAZINE-DM) 6.25-15 MG/5ML syrup Take 5 mLs by mouth 4 (four) times daily as needed. 01/15/18   Caccavale, Sophia, PA-C  traMADol (ULTRAM) 50 MG tablet Take 1 tablet  (50 mg total) by mouth every 6 (six) hours as needed. 04/02/16   Orpah Greek, MD  traZODone (DESYREL) 50 MG tablet Take 50 mg by mouth at bedtime. 04/11/15   [provider]  VYVANSE 40 MG capsule Take 40 mg by mouth every morning. 06/17/15   [provider]    Allergies    Patient has no known allergies.  Review of Systems   Review of Systems  Constitutional: Negative for appetite change, diaphoresis, fatigue, fever and unexpected weight change.  HENT: Negative for mouth sores.   Eyes: Negative for visual disturbance.  Respiratory: Negative for cough, chest tightness, shortness of breath and wheezing.   Cardiovascular: Negative for chest pain.  Gastrointestinal: Negative for abdominal pain, constipation, diarrhea, nausea and vomiting.  Endocrine: Negative for polydipsia, polyphagia and polyuria.  Genitourinary: Negative for dysuria, frequency, hematuria and urgency.  Musculoskeletal: Positive for arthralgias and back pain. Negative for neck stiffness.  Skin: Negative for rash.  Allergic/Immunologic: Negative for immunocompromised state.  Neurological: Positive for tremors ( x1 without return.). Negative for syncope, light-headedness and headaches.  Hematological: Does not bruise/bleed easily.  Psychiatric/Behavioral: Negative for sleep disturbance. The patient is not nervous/anxious.     Physical Exam Updated Vital Signs BP 132/75 (BP Location: Right Arm)   Pulse 79   Temp 98 F (36.7 C) (Oral)   Resp 16   Ht 6\' 1"  (1.854 m)   SpO2 98%   BMI 36.94 kg/m   Physical Exam Vitals and nursing note reviewed.  Constitutional:      General: He is not in acute distress.    Appearance: He is well-developed. He is not diaphoretic.  HENT:     Head: Normocephalic and atraumatic.     Mouth/Throat:     Pharynx: No oropharyngeal exudate.  Eyes:     General: No scleral icterus.    Conjunctiva/sclera: Conjunctivae normal.     Pupils: Pupils are equal, round,  and reactive to light.     Comments: No horizontal, vertical or rotational nystagmus  Neck:     Comments: Full active and passive ROM without pain No midline or paraspinal tenderness No nuchal rigidity or meningeal signs Cardiovascular:     Rate and Rhythm: Normal rate and regular rhythm.  Pulmonary:     Effort: Pulmonary effort is normal. No respiratory distress.     Breath sounds: Normal breath sounds. No wheezing or rales.  Abdominal:     General: Bowel sounds are normal. There is no distension.     Palpations: Abdomen is soft.     Tenderness: There is no abdominal tenderness. There is no guarding or rebound.  Musculoskeletal:        General: Normal range  of motion.     Cervical back: Normal range of motion and neck supple.     Comments: Midline tenderness to the lower T-spine.  No step-off or deformity.  Mild paraspinal tenderness to the T-spine and L-spine.  Lymphadenopathy:     Cervical: No cervical adenopathy.  Skin:    General: Skin is warm and dry.     Findings: No erythema or rash.  Neurological:     Mental Status: He is alert and oriented to person, place, and time.     Cranial Nerves: No cranial nerve deficit.     Motor: No abnormal muscle tone.     Coordination: Coordination normal.     Comments: Mental Status:  Alert, oriented, thought content appropriate. Speech fluent without evidence of aphasia. Able to follow 2 step commands without difficulty.  Cranial Nerves:  II:  Peripheral visual fields grossly normal, pupils equal, round, reactive to light III,IV, VI: ptosis not present, extra-ocular motions intact bilaterally  V,VII: smile symmetric, facial light touch sensation equal VIII: hearing grossly normal bilaterally  IX,X: midline uvula rise  XI: bilateral shoulder shrug equal and strong XII: midline tongue extension  Motor:  5/5 in upper and lower extremities bilaterally including strong and equal grip strength and dorsiflexion/plantar flexion Sensory:  Pinprick and light touch normal in all extremities.  Cerebellar: normal finger-to-nose with bilateral upper extremities Gait: normal gait and balance CV: distal pulses palpable throughout   Psychiatric:        Behavior: Behavior normal.        Thought Content: Thought content normal.        Judgment: Judgment normal.     ED Results / Procedures / Treatments   Labs (all labs ordered are listed, but only abnormal results are displayed) Labs Reviewed  CBC  BASIC METABOLIC PANEL    Radiology DG Thoracic Spine 2 View  Result Date: 04/24/2020 CLINICAL DATA:  Pain status post fall EXAM: THORACIC SPINE 2 VIEWS COMPARISON:  None. FINDINGS: There is no evidence of thoracic spine fracture. Alignment is normal. No other significant bone abnormalities are identified. IMPRESSION: Negative. Electronically Signed   By: Katherine Mantle M.D.   On: 04/24/2020 03:40   DG Lumbar Spine Complete  Result Date: 04/24/2020 CLINICAL DATA:  Pain EXAM: LUMBAR SPINE - COMPLETE 4+ VIEW COMPARISON:  03/15/2014 FINDINGS: There is no evidence of lumbar spine fracture. Alignment is normal. Intervertebral disc spaces are maintained. IMPRESSION: Negative. Electronically Signed   By: Katherine Mantle M.D.   On: 04/24/2020 03:42    Procedures Procedures (including critical care time)  Medications Ordered in ED Medications  ibuprofen (ADVIL) tablet 800 mg (800 mg Oral Given 04/24/20 0302)    ED Course  I have reviewed the triage vital signs and the nursing notes.  Pertinent labs & imaging results that were available during my care of the patient were reviewed by me and considered in my medical decision making (see chart for details).    MDM Rules/Calculators/A&P                       Patient presents with several complaints.  1 episode of arm tremors.  Neurologic exam within normal limits today.   4:13 AM Plain films without evidence of fracture or subluxation.  Patient ambulatory here in the emergency  department without difficulty.  No neurologic deficits on exam no loss of bowel or bladder control, fever, night sweats, weight loss, IV drug use or history of cancer.  No concern for cauda equina.  Will give conservative therapies including anti-inflammatories and muscle relaxers.  Additionally, lab work within normal limits and completely normal neurologic exam.  Unclear etiology of patient's tremors however given history these do not appear to be seizures.  Patient will need close follow-up with primary care and should return here to the emergency department for return of symptoms.  States understanding and is in agreement the plan.   Final Clinical Impression(s) / ED Diagnoses Final diagnoses:  Occasional tremors  Acute midline thoracic back pain    Rx / DC Orders ED Discharge Orders         Ordered    methocarbamol (ROBAXIN) 500 MG tablet  2 times daily     04/24/20 0415    naproxen (NAPROSYN) 500 MG tablet  2 times daily with meals     04/24/20 0415           Birgitta Uhlir, Dahlia Client, PA-C 04/24/20 0416    Ward, Layla Maw, DO 04/24/20 [id number]

## 2020-06-17 ENCOUNTER — Emergency Department (HOSPITAL_COMMUNITY)
Admission: EM | Admit: 2020-06-17 | Discharge: 2020-06-17 | Disposition: A | Discharge status: Home / Self Care | Payer: Medicaid Other | Attending: Emergency Medicine | Admitting: Emergency Medicine

## 2020-06-17 ENCOUNTER — Encounter (HOSPITAL_COMMUNITY): Payer: Self-pay | Admitting: Emergency Medicine

## 2020-06-17 DIAGNOSIS — F17290 Nicotine dependence, other tobacco product, uncomplicated: Secondary | ICD-10-CM | POA: Diagnosis not present

## 2020-06-17 DIAGNOSIS — Z79899 Other long term (current) drug therapy: Secondary | ICD-10-CM | POA: Diagnosis not present

## 2020-06-17 DIAGNOSIS — J45909 Unspecified asthma, uncomplicated: Secondary | ICD-10-CM | POA: Diagnosis not present

## 2020-06-17 DIAGNOSIS — Z20822 Contact with and (suspected) exposure to covid-19: Secondary | ICD-10-CM | POA: Insufficient documentation

## 2020-06-17 DIAGNOSIS — J069 Acute upper respiratory infection, unspecified: Secondary | ICD-10-CM | POA: Insufficient documentation

## 2020-06-17 DIAGNOSIS — R05 Cough: Secondary | ICD-10-CM | POA: Diagnosis present

## 2020-06-17 LAB — SARS CORONAVIRUS 2 BY RT PCR (HOSPITAL ORDER, PERFORMED IN ~~LOC~~ HOSPITAL LAB): SARS Coronavirus 2: NEGATIVE

## 2020-06-17 MED ORDER — ALBUTEROL SULFATE HFA 108 (90 BASE) MCG/ACT IN AERS: 1.0000 | INHALATION_SPRAY | Freq: Four times a day (QID) | RESPIRATORY_TRACT | 0 refills | Status: DC | PRN

## 2020-06-17 MED ORDER — BENZONATATE 100 MG PO CAPS
100.0000 mg | ORAL_CAPSULE | Freq: Three times a day (TID) | ORAL | 0 refills | Status: DC
Start: 2020-06-17 — End: 2020-10-19

## 2020-06-17 MED ORDER — DM-GUAIFENESIN ER 30-600 MG PO TB12
1.0000 | ORAL_TABLET | Freq: Two times a day (BID) | ORAL | 0 refills | Status: DC
Start: 2020-06-17 — End: 2021-03-03

## 2020-06-17 NOTE — Discharge Instructions (Signed)
Take Mucinex DM for congestion Take Tessalon for cough Use inhaler as needed for shortness of breath/wheezing Please return if worsening

## 2020-06-17 NOTE — ED Provider Notes (Signed)
[facility] EMERGENCY DEPARTMENT Provider Note   CSN: [id number] Arrival date & time: 06/17/20  1137     History Chief Complaint  Patient presents with  . URI    Patrick James is a 29 y.o. male who presents with cough and congestion.  Patient states that his symptoms started on Monday.  He has been blowing his nose and has been green and yellow mucus coming out.  He cannot cough anything up.  He denies fever, chills, ear pain, sore throat, chest pain.  He reports associated mild shortness of breath and wheezing at times but not currently.  He works at [employer].  He has not been vaccinated against COVID.  He is wearing a mask while at work.   HPI     Past Medical History:  Diagnosis Date  . ADHD (attention deficit hyperactivity disorder)   . ADHD (attention deficit hyperactivity disorder)   . Asthma   . Bipolar 1 disorder (HCC)   . Depression   . Eczema   . Scabies     There are no problems to display for this patient.   Past Surgical History:  Procedure Laterality Date  . EYE SURGERY         Family History  Problem Relation Age of Onset  . Hypertension Other     Social History   Tobacco Use  . Smoking status: Current Every Day Smoker    Types: Cigars  . Smokeless tobacco: Never Used  Substance Use Topics  . Alcohol use: No  . Drug use: No    Home Medications Prior to Admission medications   Medication Sig Start Date End Date Taking? Authorizing Provider  albuterol (VENTOLIN HFA) 108 (90 Base) MCG/ACT inhaler Inhale 1-2 puffs into the lungs every 6 (six) hours as needed for wheezing or shortness of breath. 06/17/20   Bethel Born, PA-C  Ascorbic Acid (VITAMIN C) 1000 MG tablet Take 1,000 mg by mouth daily.    [provider]  azithromycin (ZITHROMAX Z-PAK) 250 MG tablet Take the first 2 tablets now and then one tablet PO daily 08/25/18   Janne Napoleon, NP  benzonatate (TESSALON) 100 MG capsule Take 1 capsule (100 mg total)  by mouth every 8 (eight) hours. 06/17/20   Bethel Born, PA-C  carbamide peroxide (DEBROX) 6.5 % OTIC solution Place 5 drops into the left ear 2 (two) times daily. 08/01/17   Felicie Morn, NP  cholecalciferol (VITAMIN D) 1000 units tablet Take 1,000 Units by mouth daily.    [provider]  clotrimazole (LOTRIMIN) 1 % cream Apply to affected area 2 times daily 08/03/19   Maxwell Caul, PA-C  dextromethorphan-guaiFENesin (MUCINEX DM) 30-600 MG 12hr tablet Take 1 tablet by mouth 2 (two) times daily. 06/17/20   Bethel Born, PA-C  ibuprofen (ADVIL,MOTRIN) 800 MG tablet Take 1 tablet (800 mg total) by mouth 3 (three) times daily. 04/02/16   Gilda Crease, MD  levocetirizine (XYZAL) 5 MG tablet Take 1 tablet (5 mg total) by mouth every evening. 01/15/18   Caccavale, Sophia, PA-C  meloxicam (MOBIC) 15 MG tablet Take 1 tablet (15 mg total) by mouth daily. Patient not taking: Reported on 01/20/2016 03/24/15   Emilia Beck, PA-C  methocarbamol (ROBAXIN) 500 MG tablet Take 1 tablet (500 mg total) by mouth 2 (two) times daily. 04/24/20   Muthersbaugh, Dahlia Client, PA-C  naproxen (NAPROSYN) 500 MG tablet Take 1 tablet (500 mg total) by mouth 2 (two) times daily with  a meal. 04/24/20   Muthersbaugh, Dahlia Client, PA-C  PARoxetine (PAXIL) 20 MG tablet Take 20 mg by mouth daily.    [provider]  predniSONE (DELTASONE) 50 MG tablet Take one tablet PO daily 08/25/18   Janne Napoleon, NP  promethazine-dextromethorphan (PROMETHAZINE-DM) 6.25-15 MG/5ML syrup Take 5 mLs by mouth 4 (four) times daily as needed. 01/15/18   Caccavale, Sophia, PA-C  traMADol (ULTRAM) 50 MG tablet Take 1 tablet (50 mg total) by mouth every 6 (six) hours as needed. 04/02/16   Gilda Crease, MD  traZODone (DESYREL) 50 MG tablet Take 50 mg by mouth at bedtime. 04/11/15   [provider]  VYVANSE 40 MG capsule Take 40 mg by mouth every morning. 06/17/15   [provider]    Allergies      Patient has no known allergies.  Review of Systems   Review of Systems  Constitutional: Negative for chills and fever.  HENT: Positive for congestion. Negative for rhinorrhea and sore throat.   Respiratory: Positive for cough, shortness of breath and wheezing.   Cardiovascular: Negative for chest pain.    Physical Exam Updated Vital Signs BP 138/67   Pulse 62   Temp 98.1 F (36.7 C)   Resp 15   SpO2 100%   Physical Exam Vitals and nursing note reviewed.  Constitutional:      General: He is not in acute distress.    Appearance: He is well-developed.     Comments: Fatigued appearing.  Calm and cooperative in no acute distress.  HENT:     Head: Normocephalic and atraumatic.     Right Ear: Tympanic membrane is scarred.     Left Ear: Tympanic membrane is scarred.     Nose: Congestion present.     Mouth/Throat:     Mouth: Mucous membranes are moist.  Eyes:     General: No scleral icterus.       Right eye: No discharge.        Left eye: No discharge.     Conjunctiva/sclera: Conjunctivae normal.     Pupils: Pupils are equal, round, and reactive to light.  Cardiovascular:     Rate and Rhythm: Normal rate and regular rhythm.  Pulmonary:     Effort: Pulmonary effort is normal. No respiratory distress.     Breath sounds: Normal breath sounds.  Abdominal:     General: There is no distension.  Musculoskeletal:     Cervical back: Normal range of motion.  Skin:    General: Skin is warm and dry.  Neurological:     Mental Status: He is alert and oriented to person, place, and time.  Psychiatric:        Behavior: Behavior normal.     ED Results / Procedures / Treatments   Labs (all labs ordered are listed, but only abnormal results are displayed) Labs Reviewed  SARS CORONAVIRUS 2 BY RT PCR (HOSPITAL ORDER, PERFORMED IN [facility])    EKG None  Radiology No results found.  Procedures Procedures (including critical care time)  Medications Ordered in  ED Medications - No data to display  ED Course  I have reviewed the triage vital signs and the nursing notes.  Pertinent labs & imaging results that were available during my care of the patient were reviewed by me and considered in my medical decision making (see chart for details).  29 year old male presents with cough and congestion.  His vital signs are normal.  Exam is consistent  with viral URI.  Patient has not been vaccinated against Covid and therefore testing was recommended.  He was encouraged to rest, hydrate and try prescription cough and congestion medicines for his symptoms.  He is requesting a work note which was provided.  Patrick James was evaluated in Emergency Department on 06/17/2020 for the symptoms described in the history of present illness. He was evaluated in the context of the global COVID-19 pandemic, which necessitated consideration that the patient might be at risk for infection with the SARS-CoV-2 virus that causes COVID-19. Institutional protocols and algorithms that pertain to the evaluation of patients at risk for COVID-19 are in a state of rapid change based on information released by regulatory bodies including the CDC and federal and state organizations. These policies and algorithms were followed during the patient's care in the ED.   MDM Rules/Calculators/A&P                           Final Clinical Impression(s) / ED Diagnoses Final diagnoses:  Upper respiratory tract infection, unspecified type    Rx / DC Orders ED Discharge Orders         Ordered    dextromethorphan-guaiFENesin [facility] DM) 30-600 MG 12hr tablet  2 times daily     Discontinue  Reprint     06/17/20 1409    benzonatate (TESSALON) 100 MG capsule  Every 8 hours     Discontinue  Reprint     06/17/20 1409    albuterol (VENTOLIN HFA) 108 (90 Base) MCG/ACT inhaler  Every 6 hours PRN     Discontinue  Reprint     06/17/20 1409           Bethel Born, PA-C 06/17/20 1419     Terrilee Files, MD 06/17/20 1929

## 2020-06-17 NOTE — ED Triage Notes (Signed)
Pt reports cough and nasal congestion x3 days, denies sore throat, fever or recent sick contacts.

## 2020-07-16 ENCOUNTER — Emergency Department (HOSPITAL_COMMUNITY): Payer: Medicaid Other

## 2020-07-16 ENCOUNTER — Emergency Department (HOSPITAL_COMMUNITY)
Admission: EM | Admit: 2020-07-16 | Discharge: 2020-07-17 | Disposition: A | Discharge status: Home / Self Care | Payer: Medicaid Other | Attending: Emergency Medicine | Admitting: Emergency Medicine

## 2020-07-16 DIAGNOSIS — Y9389 Activity, other specified: Secondary | ICD-10-CM | POA: Diagnosis not present

## 2020-07-16 DIAGNOSIS — Z5321 Procedure and treatment not carried out due to patient leaving prior to being seen by health care provider: Secondary | ICD-10-CM | POA: Diagnosis not present

## 2020-07-16 DIAGNOSIS — X58XXXA Exposure to other specified factors, initial encounter: Secondary | ICD-10-CM | POA: Diagnosis not present

## 2020-07-16 DIAGNOSIS — S8012XA Contusion of left lower leg, initial encounter: Secondary | ICD-10-CM | POA: Insufficient documentation

## 2020-07-16 DIAGNOSIS — Y9289 Other specified places as the place of occurrence of the external cause: Secondary | ICD-10-CM | POA: Diagnosis not present

## 2020-07-16 DIAGNOSIS — Y999 Unspecified external cause status: Secondary | ICD-10-CM | POA: Insufficient documentation

## 2020-07-16 DIAGNOSIS — S8992XA Unspecified injury of left lower leg, initial encounter: Secondary | ICD-10-CM | POA: Diagnosis present

## 2020-07-16 MED ORDER — OXYCODONE-ACETAMINOPHEN 5-325 MG PO TABS
1.0000 | ORAL_TABLET | ORAL | Status: DC | PRN
  Administered 2020-07-16: 1 via ORAL
  Filled 2020-07-16: qty 1

## 2020-07-16 MED ORDER — ONDANSETRON 4 MG PO TBDP
4.0000 mg | ORAL_TABLET | Freq: Once | ORAL | Status: AC
  Administered 2020-07-16: 4 mg via ORAL
  Filled 2020-07-16: qty 1
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
## 2020-07-17 NOTE — ED Notes (Signed)
Pt named called multiple times no response

## 2020-07-17 NOTE — ED Notes (Signed)
Pt called for vital signs no response

## 2020-07-25 ENCOUNTER — Emergency Department (HOSPITAL_COMMUNITY): Payer: Medicaid Other

## 2020-07-25 ENCOUNTER — Emergency Department (HOSPITAL_COMMUNITY)
Admission: EM | Admit: 2020-07-25 | Discharge: 2020-07-25 | Disposition: A | Discharge status: Home / Self Care | Payer: Medicaid Other | Attending: Emergency Medicine | Admitting: Emergency Medicine

## 2020-07-25 ENCOUNTER — Encounter (HOSPITAL_COMMUNITY): Payer: Self-pay

## 2020-07-25 ENCOUNTER — Other Ambulatory Visit: Payer: Self-pay

## 2020-07-25 DIAGNOSIS — Y93H1 Activity, digging, shoveling and raking: Secondary | ICD-10-CM | POA: Insufficient documentation

## 2020-07-25 DIAGNOSIS — F17290 Nicotine dependence, other tobacco product, uncomplicated: Secondary | ICD-10-CM | POA: Diagnosis not present

## 2020-07-25 DIAGNOSIS — J45909 Unspecified asthma, uncomplicated: Secondary | ICD-10-CM | POA: Diagnosis not present

## 2020-07-25 DIAGNOSIS — W228XXA Striking against or struck by other objects, initial encounter: Secondary | ICD-10-CM | POA: Insufficient documentation

## 2020-07-25 DIAGNOSIS — S8992XA Unspecified injury of left lower leg, initial encounter: Secondary | ICD-10-CM | POA: Insufficient documentation

## 2020-07-25 DIAGNOSIS — Y999 Unspecified external cause status: Secondary | ICD-10-CM | POA: Diagnosis not present

## 2020-07-25 DIAGNOSIS — Z7951 Long term (current) use of inhaled steroids: Secondary | ICD-10-CM | POA: Diagnosis not present

## 2020-07-25 DIAGNOSIS — S8012XA Contusion of left lower leg, initial encounter: Secondary | ICD-10-CM | POA: Diagnosis not present

## 2020-07-25 DIAGNOSIS — Y9289 Other specified places as the place of occurrence of the external cause: Secondary | ICD-10-CM | POA: Diagnosis not present
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
## 2020-07-25 NOTE — Discharge Instructions (Signed)
Keep your leg elevated as much as possible for the next 2 days.  Ibuprofen 600 mg every 6 hours as needed for pain.  Follow-up with your primary doctor if not improving in the next week, and return to the ER if symptoms significantly worsen or change.
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
## 2020-10-04 ENCOUNTER — Encounter (HOSPITAL_COMMUNITY): Payer: Self-pay | Admitting: *Deleted

## 2020-10-04 ENCOUNTER — Emergency Department (HOSPITAL_COMMUNITY): Payer: Medicaid Other

## 2020-10-04 ENCOUNTER — Other Ambulatory Visit: Payer: Self-pay

## 2020-10-04 ENCOUNTER — Emergency Department (HOSPITAL_COMMUNITY)
Admission: EM | Admit: 2020-10-04 | Discharge: 2020-10-05 | Disposition: A | Discharge status: Home / Self Care | Payer: Medicaid Other | Attending: Emergency Medicine | Admitting: Emergency Medicine

## 2020-10-04 DIAGNOSIS — F17290 Nicotine dependence, other tobacco product, uncomplicated: Secondary | ICD-10-CM | POA: Diagnosis not present

## 2020-10-04 DIAGNOSIS — R1110 Vomiting, unspecified: Secondary | ICD-10-CM | POA: Diagnosis not present

## 2020-10-04 DIAGNOSIS — R1011 Right upper quadrant pain: Secondary | ICD-10-CM | POA: Insufficient documentation

## 2020-10-04 DIAGNOSIS — R197 Diarrhea, unspecified: Secondary | ICD-10-CM | POA: Diagnosis not present

## 2020-10-04 DIAGNOSIS — Z7952 Long term (current) use of systemic steroids: Secondary | ICD-10-CM | POA: Diagnosis not present

## 2020-10-04 DIAGNOSIS — F909 Attention-deficit hyperactivity disorder, unspecified type: Secondary | ICD-10-CM | POA: Diagnosis not present

## 2020-10-04 DIAGNOSIS — J45909 Unspecified asthma, uncomplicated: Secondary | ICD-10-CM | POA: Diagnosis not present

## 2020-10-04 DIAGNOSIS — R10811 Right upper quadrant abdominal tenderness: Secondary | ICD-10-CM

## 2020-10-04 DIAGNOSIS — Z20822 Contact with and (suspected) exposure to covid-19: Secondary | ICD-10-CM | POA: Insufficient documentation

## 2020-10-04 LAB — CBC
HCT: 48.9 % (ref 39.0–52.0)
Hemoglobin: 15.8 g/dL (ref 13.0–17.0)
MCH: 30.3 pg (ref 26.0–34.0)
MCHC: 32.3 g/dL (ref 30.0–36.0)
MCV: 93.9 fL (ref 80.0–100.0)
Platelets: 175 10*3/uL (ref 150–400)
RBC: 5.21 MIL/uL (ref 4.22–5.81)
RDW: 12.3 % (ref 11.5–15.5)
WBC: 10.8 10*3/uL — ABNORMAL HIGH (ref 4.0–10.5)
nRBC: 0 % (ref 0.0–0.2)

## 2020-10-04 LAB — CBG MONITORING, ED: Glucose-Capillary: 116 mg/dL — ABNORMAL HIGH (ref 70–99)

## 2020-10-04 LAB — BASIC METABOLIC PANEL
Anion gap: 13 (ref 5–15)
BUN: 12 mg/dL (ref 6–20)
CO2: 23 mmol/L (ref 22–32)
Calcium: 9.6 mg/dL (ref 8.9–10.3)
Chloride: 104 mmol/L (ref 98–111)
Creatinine, Ser: 0.88 mg/dL (ref 0.61–1.24)
GFR, Estimated: >60 mL/min (ref >60)
Glucose, Bld: 109 mg/dL — ABNORMAL HIGH (ref 70–99)
Potassium: 3.8 mmol/L (ref 3.5–5.1)
Sodium: 140 mmol/L (ref 135–145)

## 2020-10-04 LAB — TROPONIN I (HIGH SENSITIVITY): Troponin I (High Sensitivity): 4 ng/L (ref <18)

## 2020-10-04 MED ORDER — LACTATED RINGERS IV BOLUS
1000.0000 mL | Freq: Once | INTRAVENOUS | Status: AC
  Administered 2020-10-04: 1000 mL via INTRAVENOUS

## 2020-10-04 NOTE — ED Provider Notes (Signed)
[facility] EMERGENCY DEPARTMENT Provider Note   CSN: [id number] Arrival date & time: 10/04/20  2005     History Chief Complaint  Patient presents with  . Abdominal Pain  . Chest Pain    Patrick James is a 29 y.o. male.  HPI      Patrick James is a 29 y.o. male, with a history of asthma, bipolar, presenting to the ED with upper abdominal pain beginning this morning. Patient states he began to have aching/cramping right upper quadrant abdominal pain, waxing and waning, radiating towards left shoulder, currently 5/10, worse with movement or palpation. Two episodes of nonbloody nonbilious emesis along with diarrhea.  States he has been "feeling hot." Last food intake around 4 PM this afternoon.  Denies known fever, cough, shortness of breath, back pain, hematochezia/melena, urinary symptoms, or any other complaints.  Past Medical History:  Diagnosis Date  . ADHD (attention deficit hyperactivity disorder)   . ADHD (attention deficit hyperactivity disorder)   . Asthma   . Bipolar 1 disorder (HCC)   . Depression   . Eczema   . Scabies     There are no problems to display for this patient.   Past Surgical History:  Procedure Laterality Date  . EYE SURGERY         Family History  Problem Relation Age of Onset  . Hypertension Other     Social History   Tobacco Use  . Smoking status: Current Every Day Smoker    Types: Cigars  . Smokeless tobacco: Never Used  Substance Use Topics  . Alcohol use: Yes  . Drug use: No    Home Medications Prior to Admission medications   Medication Sig Start Date End Date Taking? Authorizing Provider  albuterol (VENTOLIN HFA) 108 (90 Base) MCG/ACT inhaler Inhale 1-2 puffs into the lungs every 6 (six) hours as needed for wheezing or shortness of breath. 06/17/20   Bethel Born, PA-C  Ascorbic Acid (VITAMIN C) 1000 MG tablet Take 1,000 mg by mouth daily.    [provider]  azithromycin  (ZITHROMAX Z-PAK) 250 MG tablet Take the first 2 tablets now and then one tablet PO daily 08/25/18   Janne Napoleon, NP  benzonatate (TESSALON) 100 MG capsule Take 1 capsule (100 mg total) by mouth every 8 (eight) hours. 06/17/20   Bethel Born, PA-C  carbamide peroxide (DEBROX) 6.5 % OTIC solution Place 5 drops into the left ear 2 (two) times daily. 08/01/17   Felicie Morn, NP  cholecalciferol (VITAMIN D) 1000 units tablet Take 1,000 Units by mouth daily.    [provider]  clotrimazole (LOTRIMIN) 1 % cream Apply to affected area 2 times daily 08/03/19   Maxwell Caul, PA-C  dextromethorphan-guaiFENesin (MUCINEX DM) 30-600 MG 12hr tablet Take 1 tablet by mouth 2 (two) times daily. 06/17/20   Bethel Born, PA-C  ibuprofen (ADVIL,MOTRIN) 800 MG tablet Take 1 tablet (800 mg total) by mouth 3 (three) times daily. 04/02/16   Gilda Crease, MD  levocetirizine (XYZAL) 5 MG tablet Take 1 tablet (5 mg total) by mouth every evening. 01/15/18   Caccavale, Sophia, PA-C  meloxicam (MOBIC) 15 MG tablet Take 1 tablet (15 mg total) by mouth daily. Patient not taking: Reported on 01/20/2016 03/24/15   Emilia Beck, PA-C  methocarbamol (ROBAXIN) 500 MG tablet Take 1 tablet (500 mg total) by mouth 2 (two) times daily. 04/24/20   Muthersbaugh, Dahlia Client, PA-C  naproxen (NAPROSYN) 500 MG tablet  Take 1 tablet (500 mg total) by mouth 2 (two) times daily with a meal. 04/24/20   Muthersbaugh, Dahlia Client, PA-C  PARoxetine (PAXIL) 20 MG tablet Take 20 mg by mouth daily.    [provider]  predniSONE (DELTASONE) 50 MG tablet Take one tablet PO daily 08/25/18   Janne Napoleon, NP  promethazine-dextromethorphan (PROMETHAZINE-DM) 6.25-15 MG/5ML syrup Take 5 mLs by mouth 4 (four) times daily as needed. 01/15/18   Caccavale, Sophia, PA-C  traMADol (ULTRAM) 50 MG tablet Take 1 tablet (50 mg total) by mouth every 6 (six) hours as needed. 04/02/16   Gilda Crease, MD  traZODone (DESYREL) 50 MG  tablet Take 50 mg by mouth at bedtime. 04/11/15   [provider]  VYVANSE 40 MG capsule Take 40 mg by mouth every morning. 06/17/15   [provider]    Allergies    Patient has no known allergies.  Review of Systems   Review of Systems  Constitutional: Negative for fever.  Respiratory: Negative for shortness of breath.   Gastrointestinal: Positive for diarrhea, nausea and vomiting.  Genitourinary: Negative for difficulty urinating, dysuria and hematuria.  Musculoskeletal: Negative for back pain.  All other systems reviewed and are negative.   Physical Exam Updated Vital Signs BP 136/80 (BP Location: Left Arm)   Pulse 76   Temp 98.1 F (36.7 C) (Oral)   Resp 18   Ht 6\' 1"  (1.854 m)   Wt 131 kg   SpO2 100%   BMI 38.10 kg/m   Physical Exam Vitals and nursing note reviewed.  Constitutional:      General: He is not in acute distress.    Appearance: He is well-developed. He is not diaphoretic.  HENT:     Head: Normocephalic and atraumatic.     Mouth/Throat:     Mouth: Mucous membranes are moist.     Pharynx: Oropharynx is clear.  Eyes:     Conjunctiva/sclera: Conjunctivae normal.  Cardiovascular:     Rate and Rhythm: Normal rate and regular rhythm.     Pulses: Normal pulses.          Radial pulses are 2+ on the right side and 2+ on the left side.     Heart sounds: Normal heart sounds.  Pulmonary:     Effort: Pulmonary effort is normal. No respiratory distress.     Breath sounds: Normal breath sounds.  Abdominal:     Palpations: Abdomen is soft.     Tenderness: There is abdominal tenderness in the right upper quadrant. There is no guarding. Positive signs include Murphy's sign.  Musculoskeletal:     Cervical back: Neck supple.     Right lower leg: No edema.     Left lower leg: No edema.  Lymphadenopathy:     Cervical: No cervical adenopathy.  Skin:    General: Skin is warm and dry.  Neurological:     Mental Status: He is alert.  Psychiatric:         Mood and Affect: Mood and affect normal.        Speech: Speech normal.        Behavior: Behavior normal.     ED Results / Procedures / Treatments   Labs (all labs ordered are listed, but only abnormal results are displayed) Labs Reviewed  BASIC METABOLIC PANEL - Abnormal; Notable for the following components:      Result Value   Glucose, Bld 109 (*)    All other components within normal limits  CBC - Abnormal; Notable for the following components:   WBC 10.8 (*)    All other components within normal limits  CBG MONITORING, ED - Abnormal; Notable for the following components:   Glucose-Capillary 116 (*)    All other components within normal limits  RESPIRATORY PANEL BY RT PCR (FLU A&B, COVID)  HEPATIC FUNCTION PANEL  LIPASE, BLOOD  TROPONIN I (HIGH SENSITIVITY)  TROPONIN I (HIGH SENSITIVITY)    EKG EKG Interpretation  Date/Time:  Sunday October 04 2020 20:13:01 EDT Ventricular Rate:  75 PR Interval:  148 QRS Duration: 108 QT Interval:  374 QTC Calculation: 417 R Axis:   33 Text Interpretation: Normal sinus rhythm Normal ECG No significant change since last tracing Confirmed by Richardean Canal [phone]) on 10/04/2020 9:02:49 PM   Radiology DG Chest 2 View  Result Date: 10/04/2020 CLINICAL DATA:  Chest and abdominal pain. EXAM: CHEST - 2 VIEW COMPARISON:  08/25/2018 FINDINGS: The heart size and mediastinal contours are within normal limits. Both lungs are clear. The visualized skeletal structures are unremarkable. IMPRESSION: Normal study. Electronically Signed   By: Charlett Nose M.D.   On: 10/04/2020 21:12    Procedures Procedures (including critical care time)  Medications Ordered in ED Medications  lactated ringers bolus 1,000 mL (1,000 mLs Intravenous New Bag/Given 10/04/20 2254)    ED Course  I have reviewed the triage vital signs and the nursing notes.  Pertinent labs & imaging results that were available during my care of the patient were reviewed by me  and considered in my medical decision making (see chart for details).    MDM Rules/Calculators/A&P                          Patient presents with right upper quadrant pain and vomiting. Patient is nontoxic appearing, afebrile, not tachycardic, not tachypneic, not hypotensive, maintains excellent SPO2 on room air, and is in no apparent distress.   I reviewed and interpreted the patient's available labs. Mild leukocytosis present.  Initial troponin negative.  End of shift patient care handoff report given to Sharilyn Sites, PA-C. Plan: Hepatic function panel, lipase, and RUQ ultrasound pending.   Final Clinical Impression(s) / ED Diagnoses Final diagnoses:  RUQ abdominal tenderness    Rx / DC Orders ED Discharge Orders    None       Concepcion Living 10/04/20 2356    Charlynne Pander, MD 10/05/20 1600

## 2020-10-04 NOTE — ED Triage Notes (Signed)
The pt is c/o abd pain sweating  And chest pain all day  He has asked for a note for work no acute diestress

## 2020-10-05 LAB — HEPATIC FUNCTION PANEL
ALT: 17 U/L (ref 0–44)
AST: 20 U/L (ref 15–41)
Albumin: 4.1 g/dL (ref 3.5–5.0)
Alkaline Phosphatase: 43 U/L (ref 38–126)
Bilirubin, Direct: 0.2 mg/dL (ref 0.0–0.2)
Indirect Bilirubin: 0.7 mg/dL (ref 0.3–0.9)
Total Bilirubin: 0.9 mg/dL (ref 0.3–1.2)
Total Protein: 7 g/dL (ref 6.5–8.1)

## 2020-10-05 LAB — RESPIRATORY PANEL BY RT PCR (FLU A&B, COVID)
Influenza A by PCR: NEGATIVE
Influenza B by PCR: NEGATIVE
SARS Coronavirus 2 by RT PCR: NEGATIVE

## 2020-10-05 LAB — TROPONIN I (HIGH SENSITIVITY): Troponin I (High Sensitivity): 3 ng/L (ref <18)

## 2020-10-05 LAB — LIPASE, BLOOD: Lipase: 28 U/L (ref 11–51)

## 2020-10-05 MED ORDER — ALBUTEROL SULFATE HFA 108 (90 BASE) MCG/ACT IN AERS: 2.0000 | INHALATION_SPRAY | RESPIRATORY_TRACT | 0 refills | Status: DC | PRN

## 2020-10-05 MED ORDER — ONDANSETRON 4 MG PO TBDP: 4.0000 mg | ORAL_TABLET | Freq: Three times a day (TID) | ORAL | 0 refills | Status: DC | PRN

## 2020-10-05 NOTE — Discharge Instructions (Signed)
Medications sent to pharmacy for you. Follow-up with wellness clinic-- call in the morning to set up appt. Return here for new concerns.

## 2020-10-05 NOTE — ED Provider Notes (Signed)
Assumed care from PA Joy at shift change.  See prior notes for full H&P.  Briefly, 29 y.o. M here with RUQ abdominal pain, nausea, vomiting, and diarrhea.  Initially had cardiac work-up in triage which was reassuring.  Found to have RUQ tenderness on exam here.  Plan:  LFT's, lipase, delta trop, and RUQ [place] pending.  dispo pending results.  Results for orders placed or performed during the hospital encounter of 10/04/20  Respiratory Panel by RT PCR (Flu A&B, Covid) - Nasopharyngeal Swab   Specimen: Nasopharyngeal Swab  Result Value Ref Range   SARS Coronavirus 2 by RT PCR NEGATIVE NEGATIVE   Influenza A by PCR NEGATIVE NEGATIVE   Influenza B by PCR NEGATIVE NEGATIVE  Basic metabolic panel  Result Value Ref Range   Sodium 140 135 - 145 mmol/L   Potassium 3.8 3.5 - 5.1 mmol/L   Chloride 104 98 - 111 mmol/L   CO2 23 22 - 32 mmol/L   Glucose, Bld 109 (H) 70 - 99 mg/dL   BUN 12 6 - 20 mg/dL   Creatinine, Ser 5.46 0.61 - 1.24 mg/dL   Calcium 9.6 8.9 - 27.0 mg/dL   GFR, Estimated >35 >00 mL/min   Anion gap 13 5 - 15  CBC  Result Value Ref Range   WBC 10.8 (H) 4.0 - 10.5 K/uL   RBC 5.21 4.22 - 5.81 MIL/uL   Hemoglobin 15.8 13.0 - 17.0 g/dL   HCT 93.8 39 - 52 %   MCV 93.9 80.0 - 100.0 fL   MCH 30.3 26.0 - 34.0 pg   MCHC 32.3 30.0 - 36.0 g/dL   RDW 18.2 99.3 - 71.6 %   Platelets 175 150 - 400 K/uL   nRBC 0.0 0.0 - 0.2 %  Hepatic function panel  Result Value Ref Range   Total Protein 7.0 6.5 - 8.1 g/dL   Albumin 4.1 3.5 - 5.0 g/dL   AST 20 15 - 41 U/L   ALT 17 0 - 44 U/L   Alkaline Phosphatase 43 38 - 126 U/L   Total Bilirubin 0.9 0.3 - 1.2 mg/dL   Bilirubin, Direct 0.2 0.0 - 0.2 mg/dL   Indirect Bilirubin 0.7 0.3 - 0.9 mg/dL  Lipase, blood  Result Value Ref Range   Lipase 28 11 - 51 U/L  POC CBG, ED  Result Value Ref Range   Glucose-Capillary 116 (H) 70 - 99 mg/dL   Comment 1 Notify RN   Troponin I (High Sensitivity)  Result Value Ref Range   Troponin I (High Sensitivity) 4  <18 ng/L  Troponin I (High Sensitivity)  Result Value Ref Range   Troponin I (High Sensitivity) 3 <18 ng/L   DG Chest 2 View  Result Date: 10/04/2020 CLINICAL DATA:  Chest and abdominal pain. EXAM: CHEST - 2 VIEW COMPARISON:  08/25/2018 FINDINGS: The heart size and mediastinal contours are within normal limits. Both lungs are clear. The visualized skeletal structures are unremarkable. IMPRESSION: Normal study. Electronically Signed   By: Charlett Nose M.D.   On: 10/04/2020 21:12   US Abdomen Limited RUQ (LIVER/GB)  Result Date: 10/05/2020 CLINICAL DATA:  Right upper quadrant abdominal tenderness. EXAM: ULTRASOUND ABDOMEN LIMITED RIGHT UPPER QUADRANT COMPARISON:  None. FINDINGS: Gallbladder: No gallstones or wall thickening visualized (3.0 mm). No sonographic Murphy sign noted by sonographer. Common bile duct: Diameter: 2.7 mm Liver: No focal lesion identified. Within normal limits in parenchymal echogenicity. Portal vein is patent on color Doppler imaging with normal direction of blood  flow towards the liver. Other: None. IMPRESSION: Normal right upper quadrant ultrasound. Electronically Signed   By: Aram Candela M.D.   On: 10/05/2020 00:06   LFT's, lipase, and [place] all WNL.  Patient asking to eat/drink.  Remains non-toxic in appearance.  He is requesting doctors note.  Does not currently have PCP so will refer to wellness clinic for follow-up.  Requested refill of inhaler as well, sent to pharmacy along with Rx zofran. Return here for any new/acute changes.   Garlon Hatchet, PA-C 10/05/20 0102    Marily Memos, MD 10/05/20 [phone]

## 2020-10-19 ENCOUNTER — Ambulatory Visit (HOSPITAL_COMMUNITY)
Admission: EM | Admit: 2020-10-19 | Discharge: 2020-10-19 | Disposition: A | Discharge status: Home / Self Care | Payer: Medicaid Other | Attending: Urgent Care | Admitting: Urgent Care

## 2020-10-19 ENCOUNTER — Other Ambulatory Visit: Payer: Self-pay

## 2020-10-19 ENCOUNTER — Encounter (HOSPITAL_COMMUNITY): Payer: Self-pay

## 2020-10-19 DIAGNOSIS — Z20822 Contact with and (suspected) exposure to covid-19: Secondary | ICD-10-CM | POA: Diagnosis not present

## 2020-10-19 DIAGNOSIS — J4530 Mild persistent asthma, uncomplicated: Secondary | ICD-10-CM | POA: Insufficient documentation

## 2020-10-19 DIAGNOSIS — F17210 Nicotine dependence, cigarettes, uncomplicated: Secondary | ICD-10-CM | POA: Diagnosis not present

## 2020-10-19 DIAGNOSIS — J029 Acute pharyngitis, unspecified: Secondary | ICD-10-CM

## 2020-10-19 DIAGNOSIS — R5381 Other malaise: Secondary | ICD-10-CM | POA: Diagnosis not present

## 2020-10-19 DIAGNOSIS — R062 Wheezing: Secondary | ICD-10-CM

## 2020-10-19 DIAGNOSIS — R519 Headache, unspecified: Secondary | ICD-10-CM

## 2020-10-19 DIAGNOSIS — B349 Viral infection, unspecified: Secondary | ICD-10-CM

## 2020-10-19 MED ORDER — BENZONATATE 100 MG PO CAPS
100.0000 mg | ORAL_CAPSULE | Freq: Three times a day (TID) | ORAL | 0 refills | Status: DC | PRN
Start: 2020-10-19 — End: 2021-03-03

## 2020-10-19 MED ORDER — NAPROXEN 500 MG PO TABS: 500.0000 mg | ORAL_TABLET | Freq: Two times a day (BID) | ORAL | 0 refills | Status: DC

## 2020-10-19 MED ORDER — ALBUTEROL SULFATE HFA 108 (90 BASE) MCG/ACT IN AERS: 1.0000 | INHALATION_SPRAY | Freq: Four times a day (QID) | RESPIRATORY_TRACT | 0 refills | Status: DC | PRN

## 2020-10-19 MED ORDER — PROMETHAZINE-DM 6.25-15 MG/5ML PO SYRP: 5.0000 mL | ORAL_SOLUTION | Freq: Every evening | ORAL | 0 refills | Status: DC | PRN

## 2020-10-19 MED ORDER — CETIRIZINE HCL 10 MG PO TABS: 10.0000 mg | ORAL_TABLET | Freq: Every day | ORAL | 0 refills | Status: DC

## 2020-10-19 MED ORDER — PSEUDOEPHEDRINE HCL 60 MG PO TABS: 60.0000 mg | ORAL_TABLET | Freq: Three times a day (TID) | ORAL | 0 refills | Status: DC | PRN

## 2020-10-19 NOTE — Discharge Instructions (Addendum)

## 2020-10-19 NOTE — ED Provider Notes (Signed)
[facility]   MRN: [id number] DOB: 11-01-1991  Subjective:   Patrick James is a 29 y.o. male presenting for acute onset this morning of throat pain, headache, subjective fever, malaise and fatigue, cough.  Patient has been Covid vaccinated.  Denies chest pain, shortness of breath, loss of sense of taste and smell, body aches.  Patient has not used medications for relief.  He would like a refill of his albuterol inhaler.  Has asthma and is a smoker.  No current facility-administered medications for this encounter.  Current Outpatient Medications:  .  albuterol (VENTOLIN HFA) 108 (90 Base) MCG/ACT inhaler, Inhale 2 puffs into the lungs every 4 (four) hours as needed for wheezing or shortness of breath., Disp: 18 g, Rfl: 0 .  Ascorbic Acid (VITAMIN C) 1000 MG tablet, Take 1,000 mg by mouth daily., Disp: , Rfl:  .  azithromycin (ZITHROMAX Z-PAK) 250 MG tablet, Take the first 2 tablets now and then one tablet PO daily, Disp: 6 each, Rfl: 0 .  benzonatate (TESSALON) 100 MG capsule, Take 1 capsule (100 mg total) by mouth every 8 (eight) hours., Disp: 21 capsule, Rfl: 0 .  carbamide peroxide (DEBROX) 6.5 % OTIC solution, Place 5 drops into the left ear 2 (two) times daily., Disp: 15 mL, Rfl: 0 .  cholecalciferol (VITAMIN D) 1000 units tablet, Take 1,000 Units by mouth daily., Disp: , Rfl:  .  clotrimazole (LOTRIMIN) 1 % cream, Apply to affected area 2 times daily, Disp: 15 g, Rfl: 0 .  dextromethorphan-guaiFENesin (MUCINEX DM) 30-600 MG 12hr tablet, Take 1 tablet by mouth 2 (two) times daily., Disp: 14 tablet, Rfl: 0 .  ibuprofen (ADVIL,MOTRIN) 800 MG tablet, Take 1 tablet (800 mg total) by mouth 3 (three) times daily., Disp: 21 tablet, Rfl: 0 .  levocetirizine (XYZAL) 5 MG tablet, Take 1 tablet (5 mg total) by mouth every evening., Disp: 15 tablet, Rfl: 0 .  meloxicam (MOBIC) 15 MG tablet, Take 1 tablet (15 mg total) by mouth daily. (Patient not taking: Reported on  01/20/2016), Disp: 20 tablet, Rfl: 0 .  methocarbamol (ROBAXIN) 500 MG tablet, Take 1 tablet (500 mg total) by mouth 2 (two) times daily., Disp: 20 tablet, Rfl: 0 .  naproxen (NAPROSYN) 500 MG tablet, Take 1 tablet (500 mg total) by mouth 2 (two) times daily with a meal., Disp: 20 tablet, Rfl: 0 .  ondansetron (ZOFRAN ODT) 4 MG disintegrating tablet, Take 1 tablet (4 mg total) by mouth every 8 (eight) hours as needed for nausea., Disp: 10 tablet, Rfl: 0 .  PARoxetine (PAXIL) 20 MG tablet, Take 20 mg by mouth daily., Disp: , Rfl:  .  predniSONE (DELTASONE) 50 MG tablet, Take one tablet PO daily, Disp: 5 tablet, Rfl: 0 .  promethazine-dextromethorphan (PROMETHAZINE-DM) 6.25-15 MG/5ML syrup, Take 5 mLs by mouth 4 (four) times daily as needed., Disp: 118 mL, Rfl: 0 .  traMADol (ULTRAM) 50 MG tablet, Take 1 tablet (50 mg total) by mouth every 6 (six) hours as needed., Disp: 15 tablet, Rfl: 0 .  traZODone (DESYREL) 50 MG tablet, Take 50 mg by mouth at bedtime., Disp: , Rfl: 2 .  VYVANSE 40 MG capsule, Take 40 mg by mouth every morning., Disp: , Rfl: 0   No Known Allergies  Past Medical History:  Diagnosis Date  . ADHD (attention deficit hyperactivity disorder)   . ADHD (attention deficit hyperactivity disorder)   . Asthma   . Bipolar 1 disorder (HCC)   .  Depression   . Eczema   . Scabies      Past Surgical History:  Procedure Laterality Date  . EYE SURGERY      Family History  Problem Relation Age of Onset  . Hypertension Other     Social History   Tobacco Use  . Smoking status: Current Every Day Smoker    Types: Cigars  . Smokeless tobacco: Never Used  Substance Use Topics  . Alcohol use: Yes  . Drug use: No    ROS   Objective:   Vitals: BP 121/73 (BP Location: Right Arm)   Pulse 93   Temp 100.2 F (37.9 C) (Oral)   Resp 20   SpO2 97%   Physical Exam Constitutional:      General: He is not in acute distress.    Appearance: Normal appearance. He is well-developed  and normal weight. He is not ill-appearing, toxic-appearing or diaphoretic.  HENT:     Head: Normocephalic and atraumatic.     Right Ear: External ear normal.     Left Ear: External ear normal.     Nose: Nose normal.     Mouth/Throat:     Pharynx: Oropharynx is clear. Posterior oropharyngeal erythema present. No pharyngeal swelling, oropharyngeal exudate or uvula swelling.     Tonsils: No tonsillar exudate or tonsillar abscesses.  Eyes:     General: No scleral icterus.       Right eye: No discharge.        Left eye: No discharge.     Extraocular Movements: Extraocular movements intact.     Pupils: Pupils are equal, round, and reactive to light.  Cardiovascular:     Rate and Rhythm: Normal rate.  Pulmonary:     Effort: Pulmonary effort is normal.  Musculoskeletal:     Cervical back: Normal range of motion.  Neurological:     Mental Status: He is alert and oriented to person, place, and time.  Psychiatric:        Mood and Affect: Mood normal.        Behavior: Behavior normal.        Thought Content: Thought content normal.        Judgment: Judgment normal.     Attempted a strep swab x4.  Patient refused thereafter.  Assessment and Plan :   PDMP not reviewed this encounter.  1. Sore throat   2. Acute nonintractable headache, unspecified headache type   3. Viral syndrome   4. Wheezing   5. Mild persistent asthma without complication     Unfortunately, patient was unable to cooperate with a strep swab and after 4 attempts we canceled the order.  Refilled his albuterol inhaler.  Will manage for viral illness such as viral URI, viral syndrome, viral rhinitis, COVID-19. Counseled patient on nature of COVID-19 including modes of transmission, diagnostic testing, management and supportive care.  Offered scripts for symptomatic relief. COVID 19 testing is pending. Counseled patient on potential for adverse effects with medications prescribed/recommended today, ER and return-to-clinic  precautions discussed, patient verbalized understanding.     Patrick James, [place] 10/20/20 [phone]

## 2020-10-19 NOTE — ED Triage Notes (Signed)
Pt in with c/o ST and headache that started this morning  Has taken halls cough drops with no relief  States it is hard to swallow and that his tonsils feel swollen  Denies any cough, runny nose, congestion, n/v, diarrhea

## 2020-10-20 LAB — SARS CORONAVIRUS 2 (TAT 6-24 HRS): SARS Coronavirus 2: NEGATIVE

## 2020-10-24 ENCOUNTER — Encounter (HOSPITAL_COMMUNITY): Payer: Self-pay | Admitting: Emergency Medicine

## 2020-10-24 ENCOUNTER — Ambulatory Visit (HOSPITAL_COMMUNITY)
Admission: EM | Admit: 2020-10-24 | Discharge: 2020-10-24 | Disposition: A | Discharge status: Home / Self Care | Payer: Medicaid Other | Attending: Urgent Care | Admitting: Urgent Care

## 2020-10-24 ENCOUNTER — Other Ambulatory Visit: Payer: Self-pay

## 2020-10-24 DIAGNOSIS — J029 Acute pharyngitis, unspecified: Secondary | ICD-10-CM

## 2020-10-24 MED ORDER — CEFTRIAXONE SODIUM 1 G IJ SOLR
INTRAMUSCULAR | Status: AC
  Filled 2020-10-24: qty 10

## 2020-10-24 MED ORDER — LIDOCAINE HCL (PF) 1 % IJ SOLN
INTRAMUSCULAR | Status: AC
  Filled 2020-10-24: qty 2

## 2020-10-24 MED ORDER — CLINDAMYCIN HCL 300 MG PO CAPS: 300.0000 mg | ORAL_CAPSULE | Freq: Three times a day (TID) | ORAL | 0 refills | Status: DC

## 2020-10-24 MED ORDER — CEFTRIAXONE SODIUM 1 G IJ SOLR: 1.0000 g | Freq: Once | INTRAMUSCULAR | Status: DC

## 2020-10-24 NOTE — Discharge Instructions (Signed)
If you are not better by tomorrow morning please report to the emergency room. Continue to use either ibuprofen or naproxen for pain and inflammation.

## 2020-10-24 NOTE — ED Triage Notes (Signed)
Pt c/o sore throat x 1 week. Pt states his tonsils are swollen and it is effecting his voice. He states he can only tolerate soft foods. Pt states he has had fever at onset of symptoms.

## 2020-10-24 NOTE — ED Provider Notes (Signed)
[name]   MRN: [id number] DOB: [birth year]  Subjective:   Patrick James is a 29 y.o. male presenting for 1 week history of persistent and worsening throat pain, painful swallowing. Woke up with a muffled voice today. He was last seen 10/19/2020 and we could not obtain strep swab as he could not tolerate it. He was able to get the COVID test and was negative. Refuses the strep swab today.   No current facility-administered medications for this encounter.  Current Outpatient Medications:  .  albuterol (VENTOLIN HFA) 108 (90 Base) MCG/ACT inhaler, Inhale 1-2 puffs into the lungs every 6 (six) hours as needed for wheezing or shortness of breath., Disp: 18 g, Rfl: 0 .  Ascorbic Acid (VITAMIN C) 1000 MG tablet, Take 1,000 mg by mouth daily., Disp: , Rfl:  .  azithromycin (ZITHROMAX Z-PAK) 250 MG tablet, Take the first 2 tablets now and then one tablet PO daily, Disp: 6 each, Rfl: 0 .  benzonatate (TESSALON) 100 MG capsule, Take 1-2 capsules (100-200 mg total) by mouth 3 (three) times daily as needed., Disp: 60 capsule, Rfl: 0 .  carbamide peroxide (DEBROX) 6.5 % OTIC solution, Place 5 drops into the left ear 2 (two) times daily., Disp: 15 mL, Rfl: 0 .  cetirizine (ZYRTEC ALLERGY) 10 MG tablet, Take 1 tablet (10 mg total) by mouth daily., Disp: 30 tablet, Rfl: 0 .  cholecalciferol (VITAMIN D) 1000 units tablet, Take 1,000 Units by mouth daily., Disp: , Rfl:  .  clotrimazole (LOTRIMIN) 1 % cream, Apply to affected area 2 times daily, Disp: 15 g, Rfl: 0 .  dextromethorphan-guaiFENesin (MUCINEX DM) 30-600 MG 12hr tablet, Take 1 tablet by mouth 2 (two) times daily., Disp: 14 tablet, Rfl: 0 .  ibuprofen (ADVIL,MOTRIN) 800 MG tablet, Take 1 tablet (800 mg total) by mouth 3 (three) times daily., Disp: 21 tablet, Rfl: 0 .  levocetirizine (XYZAL) 5 MG tablet, Take 1 tablet (5 mg total) by mouth every evening., Disp: 15 tablet, Rfl: 0 .  meloxicam (MOBIC) 15 MG tablet, Take 1  tablet (15 mg total) by mouth daily. (Patient not taking: Reported on 01/20/2016), Disp: 20 tablet, Rfl: 0 .  methocarbamol (ROBAXIN) 500 MG tablet, Take 1 tablet (500 mg total) by mouth 2 (two) times daily., Disp: 20 tablet, Rfl: 0 .  naproxen (NAPROSYN) 500 MG tablet, Take 1 tablet (500 mg total) by mouth 2 (two) times daily with a meal., Disp: 30 tablet, Rfl: 0 .  ondansetron (ZOFRAN ODT) 4 MG disintegrating tablet, Take 1 tablet (4 mg total) by mouth every 8 (eight) hours as needed for nausea., Disp: 10 tablet, Rfl: 0 .  PARoxetine (PAXIL) 20 MG tablet, Take 20 mg by mouth daily., Disp: , Rfl:  .  predniSONE (DELTASONE) 50 MG tablet, Take one tablet PO daily, Disp: 5 tablet, Rfl: 0 .  promethazine-dextromethorphan (PROMETHAZINE-DM) 6.25-15 MG/5ML syrup, Take 5 mLs by mouth at bedtime as needed for cough., Disp: 100 mL, Rfl: 0 .  pseudoephedrine (SUDAFED) 60 MG tablet, Take 1 tablet (60 mg total) by mouth every 8 (eight) hours as needed for congestion., Disp: 30 tablet, Rfl: 0 .  traMADol (ULTRAM) 50 MG tablet, Take 1 tablet (50 mg total) by mouth every 6 (six) hours as needed., Disp: 15 tablet, Rfl: 0 .  traZODone (DESYREL) 50 MG tablet, Take 50 mg by mouth at bedtime., Disp: , Rfl: 2 .  VYVANSE 40 MG capsule, Take 40 mg by mouth every morning., Disp: ,  Rfl: 0   No Known Allergies  Past Medical History:  Diagnosis Date  . ADHD (attention deficit hyperactivity disorder)   . ADHD (attention deficit hyperactivity disorder)   . Asthma   . Bipolar 1 disorder (HCC)   . Depression   . Eczema   . Scabies      Past Surgical History:  Procedure Laterality Date  . EYE SURGERY      Family History  Problem Relation Age of Onset  . Hypertension Other     Social History   Tobacco Use  . Smoking status: Current Every Day Smoker    Types: Cigars  . Smokeless tobacco: Never Used  Substance Use Topics  . Alcohol use: Yes  . Drug use: No    ROS   Objective:   Vitals: BP (!) 142/90  (BP Location: Left Arm)   Pulse 90   Temp 98.6 F (37 C) (Oral)   SpO2 100%   Physical Exam Constitutional:      General: He is not in acute distress.    Appearance: Normal appearance. He is well-developed and normal weight. He is not ill-appearing, toxic-appearing or diaphoretic.  HENT:     Head: Normocephalic and atraumatic.     Right Ear: External ear normal.     Left Ear: External ear normal.     Nose: Nose normal.     Mouth/Throat:     Pharynx: Pharyngeal swelling (extending from tonsil and involving the soft palate) and posterior oropharyngeal erythema present. No oropharyngeal exudate.     Tonsils: 2+ on the right.  Eyes:     General: No scleral icterus.       Right eye: No discharge.        Left eye: No discharge.     Extraocular Movements: Extraocular movements intact.     Pupils: Pupils are equal, round, and reactive to light.  Cardiovascular:     Rate and Rhythm: Normal rate.  Pulmonary:     Effort: Pulmonary effort is normal.  Musculoskeletal:     Cervical back: Normal range of motion.  Neurological:     Mental Status: He is alert and oriented to person, place, and time.  Psychiatric:        Mood and Affect: Mood normal.        Behavior: Behavior normal.        Thought Content: Thought content normal.        Judgment: Judgment normal.      Assessment and Plan :   PDMP not reviewed this encounter.  1. Pharyngitis, unspecified etiology   2. Sore throat     IM ceftriaxone in clinic, clindamycin 3 times daily as an outpatient.  Patient does have stable vital signs for outpatient management.  However, discussed possibility of tonsillar abscess, retropharyngeal abscess given his progression and recommended he report to the emergency room if he is not improved by tomorrow.  Continue to use supportive care otherwise.  Counseled patient on potential for adverse effects with medications prescribed/recommended today, ER and return-to-clinic precautions discussed,  patient verbalized understanding.    Wallis Bamberg, PA-C 10/24/20 1326

## 2020-10-24 NOTE — ED Notes (Addendum)
Pt refused strep test. Provider notified.  ?

## 2020-11-16 ENCOUNTER — Emergency Department (HOSPITAL_COMMUNITY): Payer: Medicaid Other

## 2020-11-16 ENCOUNTER — Other Ambulatory Visit: Payer: Self-pay

## 2020-11-16 ENCOUNTER — Emergency Department (HOSPITAL_COMMUNITY)
Admission: EM | Admit: 2020-11-16 | Discharge: 2020-11-17 | Disposition: A | Discharge status: Home / Self Care | Payer: Medicaid Other | Attending: Emergency Medicine | Admitting: Emergency Medicine

## 2020-11-16 ENCOUNTER — Encounter (HOSPITAL_COMMUNITY): Payer: Self-pay | Admitting: Emergency Medicine

## 2020-11-16 DIAGNOSIS — Z5321 Procedure and treatment not carried out due to patient leaving prior to being seen by health care provider: Secondary | ICD-10-CM | POA: Insufficient documentation

## 2020-11-16 DIAGNOSIS — S0512XA Contusion of eyeball and orbital tissues, left eye, initial encounter: Secondary | ICD-10-CM | POA: Diagnosis not present

## 2020-11-16 DIAGNOSIS — R519 Headache, unspecified: Secondary | ICD-10-CM | POA: Insufficient documentation

## 2020-11-16 DIAGNOSIS — S0511XA Contusion of eyeball and orbital tissues, right eye, initial encounter: Secondary | ICD-10-CM | POA: Insufficient documentation

## 2020-11-16 DIAGNOSIS — M545 Low back pain, unspecified: Secondary | ICD-10-CM | POA: Insufficient documentation

## 2020-11-16 DIAGNOSIS — M542 Cervicalgia: Secondary | ICD-10-CM | POA: Insufficient documentation

## 2020-11-16 DIAGNOSIS — S0591XA Unspecified injury of right eye and orbit, initial encounter: Secondary | ICD-10-CM | POA: Diagnosis present

## 2020-11-16 MED ORDER — ACETAMINOPHEN 325 MG PO TABS
650.0000 mg | ORAL_TABLET | Freq: Once | ORAL | Status: AC
  Administered 2020-11-16: 23:00:00 650 mg via ORAL
  Filled 2020-11-16: qty 2
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
## 2020-11-17 NOTE — ED Notes (Signed)
Patient said wait was too long

## 2021-03-02 ENCOUNTER — Other Ambulatory Visit: Payer: Self-pay

## 2021-03-02 ENCOUNTER — Ambulatory Visit: Payer: Medicaid Other | Attending: Internal Medicine | Admitting: Internal Medicine

## 2021-03-02 DIAGNOSIS — M545 Low back pain, unspecified: Secondary | ICD-10-CM

## 2021-03-02 DIAGNOSIS — F4310 Post-traumatic stress disorder, unspecified: Secondary | ICD-10-CM | POA: Diagnosis not present

## 2021-03-02 DIAGNOSIS — F411 Generalized anxiety disorder: Secondary | ICD-10-CM | POA: Diagnosis not present

## 2021-03-02 DIAGNOSIS — G8929 Other chronic pain: Secondary | ICD-10-CM

## 2021-03-02 DIAGNOSIS — Z789 Other specified health status: Secondary | ICD-10-CM | POA: Diagnosis not present
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
## 2021-03-02 NOTE — Progress Notes (Signed)
Pt states he is also having pain in lower back

## 2021-03-03 DIAGNOSIS — M545 Low back pain, unspecified: Secondary | ICD-10-CM | POA: Insufficient documentation

## 2021-03-03 DIAGNOSIS — F411 Generalized anxiety disorder: Secondary | ICD-10-CM | POA: Insufficient documentation

## 2021-03-03 DIAGNOSIS — F4310 Post-traumatic stress disorder, unspecified: Secondary | ICD-10-CM | POA: Insufficient documentation

## 2021-03-03 DIAGNOSIS — Z789 Other specified health status: Secondary | ICD-10-CM | POA: Insufficient documentation

## 2021-03-03 MED ORDER — BUSPIRONE HCL 5 MG PO TABS: 5.0000 mg | ORAL_TABLET | Freq: Two times a day (BID) | ORAL | 1 refills | Status: AC

## 2021-04-08 ENCOUNTER — Telehealth: Payer: Self-pay

## 2021-04-08 DIAGNOSIS — Z789 Other specified health status: Secondary | ICD-10-CM

## 2021-04-08 DIAGNOSIS — Z3141 Encounter for fertility testing: Secondary | ICD-10-CM

## 2021-04-08 NOTE — Telephone Encounter (Signed)
Will forward to provider  

## 2021-04-08 NOTE — Telephone Encounter (Signed)
Copied from CRM [id number]. Topic: Referral - Request for Referral >> Apr 08, 2021  9:03 AM Marylen Ponto wrote: Has patient seen PCP for this complaint? yes  *If NO, is insurance requiring patient see PCP for this issue before PCP can refer them? Referral for which specialty: Urologist Preferred provider/office: [facility] or [place]  Reason for referral: semen analysis

## 2021-04-16 ENCOUNTER — Telehealth: Payer: Self-pay | Admitting: Internal Medicine

## 2021-04-16 DIAGNOSIS — Z789 Other specified health status: Secondary | ICD-10-CM

## 2021-04-16 NOTE — Telephone Encounter (Signed)
Copied from CRM [id number]. Topic: General - Other >> Apr 16, 2021  1:27 PM Tamela Oddi wrote: Reason for CRM: Patient called to inform the doctor that he received the wrong referral.  He stated it should have been sent to [facility].  Please advise and call to discuss  at [phone]

## 2021-04-16 NOTE — Telephone Encounter (Signed)
Will forward to provider  

## 2021-04-21 NOTE — Telephone Encounter (Signed)
Pt called back to check on the status of the referral, stated it was sent to the wrong provider, Pt wanted to know when the new referral would be placed to the [facility]. Please advise .

## 2021-04-21 NOTE — Telephone Encounter (Signed)
Please follow up

## 2021-04-21 NOTE — Telephone Encounter (Signed)
Will forward to provider  

## 2021-04-23 NOTE — Telephone Encounter (Signed)
Noted Sent Referral to [facility] Dr Mariel Sleet phone # [phone] Fax # [phone] Address [address] [place] [address].

## 2021-05-14 ENCOUNTER — Ambulatory Visit (HOSPITAL_COMMUNITY): Payer: Medicaid Other | Admitting: Licensed Clinical Social Worker

## 2021-06-01 ENCOUNTER — Telehealth: Payer: Self-pay | Admitting: Internal Medicine

## 2021-06-01 DIAGNOSIS — M545 Low back pain, unspecified: Secondary | ICD-10-CM

## 2021-06-01 NOTE — Telephone Encounter (Signed)
Copied from CRM [id number]. Topic: Referral - Request for Referral >> Jun 01, 2021  3:49 PM Patrick James E wrote: Has patient seen PCP for this complaint? Yes  *If NO, is insurance requiring patient see PCP for this issue before PCP can refer them? Referral for which specialty: spine specialist  Preferred provider/office: [facility]  Reason for referral: for scoliosis

## 2021-06-01 NOTE — Telephone Encounter (Signed)
Will forward to provider to place referral  

## 2021-06-02 NOTE — Telephone Encounter (Signed)
Returned call to pt and made aware of provider response pt states he is okay with the referral being placed for PT

## 2021-07-19 ENCOUNTER — Ambulatory Visit (HOSPITAL_COMMUNITY): Payer: Self-pay | Admitting: Licensed Clinical Social Worker

## 2021-10-15 ENCOUNTER — Emergency Department (HOSPITAL_COMMUNITY)
Admission: EM | Admit: 2021-10-15 | Discharge: 2021-10-15 | Disposition: A | Discharge status: Home / Self Care | Payer: Medicaid Other | Attending: Medical | Admitting: Medical

## 2021-10-15 ENCOUNTER — Emergency Department (HOSPITAL_COMMUNITY): Payer: Medicaid Other

## 2021-10-15 ENCOUNTER — Other Ambulatory Visit: Payer: Self-pay

## 2021-10-15 DIAGNOSIS — S91102A Unspecified open wound of left great toe without damage to nail, initial encounter: Secondary | ICD-10-CM | POA: Diagnosis not present

## 2021-10-15 DIAGNOSIS — W208XXA Other cause of strike by thrown, projected or falling object, initial encounter: Secondary | ICD-10-CM | POA: Diagnosis not present

## 2021-10-15 DIAGNOSIS — Z5321 Procedure and treatment not carried out due to patient leaving prior to being seen by health care provider: Secondary | ICD-10-CM | POA: Insufficient documentation

## 2021-10-15 DIAGNOSIS — S99922A Unspecified injury of left foot, initial encounter: Secondary | ICD-10-CM | POA: Diagnosis present

## 2021-10-15 MED ORDER — TETANUS-DIPHTH-ACELL PERTUSSIS 5-2.5-18.5 LF-MCG/0.5 IM SUSY: 0.5000 mL | PREFILLED_SYRINGE | Freq: Once | INTRAMUSCULAR | Status: DC

## 2021-10-15 NOTE — ED Triage Notes (Signed)
Pt dropped a pallet on L great toe at 0300 today. C/o pain to same. Abrasion noted. Unknown last tetanus shot.

## 2021-10-15 NOTE — ED Provider Notes (Signed)
Emergency Medicine Provider Triage Evaluation Note  Patrick James , a 30 y.o. male  was evaluated in triage.  Pt complains of pain to his left great toe.  He reports he accidentally dropped a pallet on his left foot around 3 AM.  He states that he had been ignoring the pain however about 30 minutes ago he was walking when he began having significant pain in his left foot.  He states the left foot seem to give out causing his left great toe to turn underneath his foot.  He states that he accidentally hit his toe on something causing an abrasion/skin avulsion to the left great toe.  Complaining of pain since that time.  He is unsure regarding tetanus status..  Review of Systems  Positive: + foot pain, toe pain Negative: - weakness  Physical Exam  There were no vitals taken for this visit. Gen:   Awake, no distress   Resp:  Normal effort  MSK:   Moves extremities without difficulty  Other:  Skin avulsion to distal aspect of L great toe; does not involve nail. + TTP to dorsal aspect of L foot. 2+ DP pulse.   Medical Decision Making  Medically screening exam initiated at 2:52 PM.  Appropriate orders placed.  Patrick James was informed that the remainder of the evaluation will be completed by another provider, this initial triage assessment does not replace that evaluation, and the importance of remaining in the ED until their evaluation is complete.     Tanda Rockers, PA-C 10/15/21 1454    Arby Barrette, MD 10/15/21 [phone]

## 2021-10-15 NOTE — ED Notes (Signed)
Pt called x3 for vitals recheck, no response.  

## 2021-10-15 NOTE — ED Notes (Signed)
Pt called again for vitals recheck, still no response. Moving pt OTF.

## 2021-12-29 ENCOUNTER — Other Ambulatory Visit: Payer: Self-pay

## 2021-12-29 ENCOUNTER — Emergency Department (HOSPITAL_COMMUNITY)
Admission: EM | Admit: 2021-12-29 | Discharge: 2021-12-29 | Discharge status: Against Medical Advice | Payer: Medicaid Other | Source: Home / Self Care

## 2022-05-06 ENCOUNTER — Encounter (HOSPITAL_COMMUNITY): Payer: Self-pay

## 2022-05-06 ENCOUNTER — Emergency Department (HOSPITAL_COMMUNITY)
Admission: EM | Admit: 2022-05-06 | Discharge: 2022-05-06 | Disposition: A | Discharge status: Home / Self Care | Payer: Medicaid Other | Attending: Emergency Medicine | Admitting: Emergency Medicine

## 2022-05-06 ENCOUNTER — Emergency Department (HOSPITAL_COMMUNITY): Payer: Medicaid Other

## 2022-05-06 ENCOUNTER — Other Ambulatory Visit: Payer: Self-pay

## 2022-05-06 DIAGNOSIS — S4991XA Unspecified injury of right shoulder and upper arm, initial encounter: Secondary | ICD-10-CM | POA: Insufficient documentation

## 2022-05-06 DIAGNOSIS — Z5321 Procedure and treatment not carried out due to patient leaving prior to being seen by health care provider: Secondary | ICD-10-CM | POA: Insufficient documentation

## 2022-05-06 MED ORDER — IBUPROFEN 800 MG PO TABS: 800.0000 mg | ORAL_TABLET | Freq: Once | ORAL | Status: DC
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
## 2022-08-11 ENCOUNTER — Other Ambulatory Visit: Payer: Self-pay

## 2022-08-11 ENCOUNTER — Emergency Department (HOSPITAL_COMMUNITY)
Admission: EM | Admit: 2022-08-11 | Discharge: 2022-08-11 | Disposition: A | Discharge status: Home / Self Care | Payer: Medicaid Other | Attending: Student | Admitting: Student

## 2022-08-11 DIAGNOSIS — J069 Acute upper respiratory infection, unspecified: Secondary | ICD-10-CM | POA: Insufficient documentation

## 2022-08-11 DIAGNOSIS — R0981 Nasal congestion: Secondary | ICD-10-CM | POA: Diagnosis present

## 2022-08-11 DIAGNOSIS — U071 COVID-19: Secondary | ICD-10-CM | POA: Insufficient documentation

## 2022-08-11 LAB — RESP PANEL BY RT-PCR (FLU A&B, COVID) ARPGX2
Influenza A by PCR: NEGATIVE
Influenza B by PCR: NEGATIVE
SARS Coronavirus 2 by RT PCR: POSITIVE — AB

## 2022-08-11 MED ORDER — FLUTICASONE PROPIONATE 50 MCG/ACT NA SUSP
1.0000 | Freq: Every day | NASAL | 2 refills | Status: AC
Start: 2022-08-11 — End: ?

## 2022-08-11 NOTE — ED Provider Notes (Signed)
[facility] EMERGENCY DEPARTMENT Provider Note   CSN: [id number] Arrival date & time: 08/11/22  1854     History  No chief complaint on file.   Patrick James is a 31 y.o. male.  HPI   Patient presents with a few days of nasal congestion.  Also endorsing generalized myalgias.  No fevers, has been taking DayQuil which helps somewhat.  Denies any chest pain or shortness of breath, no productive cough.  Home Medications Prior to Admission medications   Medication Sig Start Date End Date Taking? Authorizing Provider  fluticasone (FLONASE) 50 MCG/ACT nasal spray Place 1 spray into both nostrils daily. 08/11/22  Yes Theron Arista, PA-C  busPIRone (BUSPAR) 5 MG tablet Take 1 tablet (5 mg total) by mouth 2 (two) times daily. 03/03/21   Marcine Matar, MD      Allergies    Patient has no known allergies.    Review of Systems   Review of Systems  Physical Exam Updated Vital Signs BP 128/76   Pulse 78   Temp 98.1 F (36.7 C) (Oral)   Resp 16   SpO2 97%  Physical Exam Vitals and nursing note reviewed. Exam conducted with a chaperone present.  Constitutional:      Appearance: Normal appearance.  HENT:     Head: Normocephalic and atraumatic.     Nose: Congestion present.     Mouth/Throat:     Pharynx: Posterior oropharyngeal erythema present.  Eyes:     General: No scleral icterus.       Right eye: No discharge.        Left eye: No discharge.     Extraocular Movements: Extraocular movements intact.     Pupils: Pupils are equal, round, and reactive to light.  Cardiovascular:     Rate and Rhythm: Normal rate and regular rhythm.     Pulses: Normal pulses.     Heart sounds: Normal heart sounds. No murmur heard.    No friction rub. No gallop.  Pulmonary:     Effort: Pulmonary effort is normal. No respiratory distress.     Breath sounds: Normal breath sounds.  Abdominal:     General: Abdomen is flat. Bowel sounds are normal. There is no distension.      Palpations: Abdomen is soft.     Tenderness: There is no abdominal tenderness.  Skin:    General: Skin is warm and dry.     Coloration: Skin is not jaundiced.  Neurological:     Mental Status: He is alert. Mental status is at baseline.     Coordination: Coordination normal.     ED Results / Procedures / Treatments   Labs (all labs ordered are listed, but only abnormal results are displayed) Labs Reviewed  RESP PANEL BY RT-PCR (FLU A&B, COVID) ARPGX2    EKG None  Radiology No results found.  Procedures Procedures    Medications Ordered in ED Medications - No data to display  ED Course/ Medical Decision Making/ A&P                           Medical Decision Making  Patient presents with viral symptoms.  Differential includes but not limited to viral URI, pneumonia, sinusitis.  On exam patient is very well-appearing, lungs are clear to auscultation bilaterally.  He is afebrile, vital stable without any SIRS criteria.  There is no abdominal tenderness.  Considered additional work-up the patient symptoms seem very  in line with a viral process especially given the acuity.  Do not think chest x-ray would be beneficial, doubt pneumonia.  Return precaution discussed, Flonase sent to his pharmacy and work note provided.  Patient discharged stable condition.        Final Clinical Impression(s) / ED Diagnoses Final diagnoses:  Viral URI    Rx / DC Orders ED Discharge Orders          Ordered    fluticasone (FLONASE) 50 MCG/ACT nasal spray  Daily        08/11/22 1915              Theron Arista, Cordelia Poche 08/11/22 1918    Kommor, Wyn Forster, MD 08/11/22 2350

## 2022-08-11 NOTE — ED Triage Notes (Signed)
Pt here via [facility] for URI, states he "feels like shit", pt doesn't elaborate on specific symptoms, 97%RA, 66HR, 134/78

## 2022-08-11 NOTE — ED Provider Triage Note (Signed)
Emergency Medicine Provider Triage Evaluation Note  Patrick James , a 31 y.o. male  was evaluated in triage.  Pt complains of URI symptoms times few days.  Nasal congestion, left ear pain.  No chest pain  Review of Systems  Per HPI  Physical Exam  There were no vitals taken for this visit. Gen:   Awake, no distress   Resp:  Normal effort  MSK:   Moves extremities without difficulty  Other:  Nasal congestion, lungs clear to auscultation bilaterally  Medical Decision Making  Medically screening exam initiated at 6:58 PM.  Appropriate orders placed.  Patrick James was informed that the remainder of the evaluation will be completed by another provider, this initial triage assessment does not replace that evaluation, and the importance of remaining in the ED until their evaluation is complete.     Theron Arista, PA-C 08/11/22 1859

## 2022-08-11 NOTE — Discharge Instructions (Signed)
Continue taking your DayQuil.  You can also use Flonase for the nasal congestion.  Take Tylenol Motrin for pain.  Follow-up with your primary presenting symptoms.

## 2022-08-28 ENCOUNTER — Encounter (HOSPITAL_COMMUNITY): Payer: Self-pay | Admitting: Emergency Medicine

## 2022-08-28 ENCOUNTER — Emergency Department (HOSPITAL_COMMUNITY)
Admission: EM | Admit: 2022-08-28 | Discharge: 2022-08-29 | Disposition: A | Discharge status: Home / Self Care | Payer: Medicaid Other | Attending: Emergency Medicine | Admitting: Emergency Medicine

## 2022-08-28 ENCOUNTER — Other Ambulatory Visit: Payer: Self-pay

## 2022-08-28 DIAGNOSIS — R5383 Other fatigue: Secondary | ICD-10-CM | POA: Diagnosis not present

## 2022-08-28 DIAGNOSIS — Z8616 Personal history of COVID-19: Secondary | ICD-10-CM | POA: Diagnosis not present

## 2022-08-28 DIAGNOSIS — J45909 Unspecified asthma, uncomplicated: Secondary | ICD-10-CM | POA: Insufficient documentation

## 2022-08-28 DIAGNOSIS — J069 Acute upper respiratory infection, unspecified: Secondary | ICD-10-CM

## 2022-08-28 DIAGNOSIS — R0981 Nasal congestion: Secondary | ICD-10-CM | POA: Diagnosis present

## 2022-08-28 DIAGNOSIS — R42 Dizziness and giddiness: Secondary | ICD-10-CM | POA: Diagnosis not present

## 2022-08-28 LAB — BASIC METABOLIC PANEL
Anion gap: 11 (ref 5–15)
BUN: 19 mg/dL (ref 6–20)
CO2: 21 mmol/L — ABNORMAL LOW (ref 22–32)
Calcium: 9.4 mg/dL (ref 8.9–10.3)
Chloride: 104 mmol/L (ref 98–111)
Creatinine, Ser: 1.02 mg/dL (ref 0.61–1.24)
GFR, Estimated: >60 mL/min (ref >60)
Glucose, Bld: 87 mg/dL (ref 70–99)
Potassium: 3.6 mmol/L (ref 3.5–5.1)
Sodium: 136 mmol/L (ref 135–145)

## 2022-08-28 LAB — CBC
HCT: 48.1 % (ref 39.0–52.0)
Hemoglobin: 15.8 g/dL (ref 13.0–17.0)
MCH: 30.4 pg (ref 26.0–34.0)
MCHC: 32.8 g/dL (ref 30.0–36.0)
MCV: 92.5 fL (ref 80.0–100.0)
Platelets: 193 10*3/uL (ref 150–400)
RBC: 5.2 MIL/uL (ref 4.22–5.81)
RDW: 12 % (ref 11.5–15.5)
WBC: 10.1 10*3/uL (ref 4.0–10.5)
nRBC: 0 % (ref 0.0–0.2)

## 2022-08-28 LAB — CBG MONITORING, ED: Glucose-Capillary: 92 mg/dL (ref 70–99)

## 2022-08-28 NOTE — ED Triage Notes (Addendum)
Per EMS, pt tested positive for COVID X2 weeks ago. Went to work today, is fatigue, runny nose, slight cough, no fever.  112pal. 70Pulse 99% RA 89CBG  "I think I have COVID again."  Congestion, fatigue, chills.

## 2022-08-29 NOTE — Discharge Instructions (Addendum)
Please drink plenty of fluids Use acetaminophen as needed for fever or body aches Return to the emergency department if you are having worsening symptoms especially increased lightheadedness, fever, chills or inability to tolerate fluids

## 2022-08-29 NOTE — ED Provider Notes (Signed)
[facility] EMERGENCY DEPARTMENT Provider Note   CSN: [id number] Arrival date & time: 08/28/22  1915     History  Chief Complaint  Patient presents with   Fatigue    Brooklyn Geoffry Bannister is a 31 y.o. male.  HPI 31 year old male history of asthma, bipolar, pain, presents today with nasal congestion and leg pain with "his leg giving out".  He reports he was lightheaded at that time.  He states he has been back to work and his initial symptoms had improved.  He noted a lot of nasal congestion and fatigue yesterday.  He reports that he was coming home from work and he felt very fatigued.  He was walking to the bus stop.  His right knee began to give out he sat down.  He feels like he has recurrence of his COVID symptoms.  Mainly he states his nose is running like a faucet.  He denies cough, dyspnea, fever, chills, nausea, vomiting, or diarrhea.  States that he has been around a lot of people with congestion and coughing.     Home Medications Prior to Admission medications   Medication Sig Start Date End Date Taking? Authorizing Provider  busPIRone (BUSPAR) 5 MG tablet Take 1 tablet (5 mg total) by mouth 2 (two) times daily. Patient not taking: Reported on 08/28/2022 03/03/21   Marcine Matar, MD  fluticasone [facility]) 50 MCG/ACT nasal spray Place 1 spray into both nostrils daily. Patient not taking: Reported on 08/28/2022 08/11/22   Theron Arista, PA-C      Allergies    Patient has no known allergies.    Review of Systems   Review of Systems  Physical Exam Updated Vital Signs BP 114/75 (BP Location: Right Arm)   Pulse 79   Temp (!) 97.4 F (36.3 C) (Oral)   Resp 17   Ht 1.88 m (6\' 2" )   Wt 131.5 kg   SpO2 100%   BMI 37.23 kg/m  Physical Exam Vitals and nursing note reviewed.  Constitutional:      Appearance: Normal appearance.  HENT:     Head: Normocephalic.     Right Ear: External ear normal.     Nose: Nose normal.     Mouth/Throat:     Mouth:  Mucous membranes are moist.  Eyes:     Pupils: Pupils are equal, round, and reactive to light.  Cardiovascular:     Rate and Rhythm: Normal rate.  Pulmonary:     Effort: Pulmonary effort is normal.     Breath sounds: Normal breath sounds.  Abdominal:     General: Abdomen is flat. Bowel sounds are normal.     Palpations: Abdomen is soft.  Musculoskeletal:        General: Normal range of motion.  Skin:    General: Skin is warm.     Capillary Refill: Capillary refill takes less than 2 seconds.  Neurological:     General: No focal deficit present.     Mental Status: He is alert.  Psychiatric:        Mood and Affect: Mood normal.     ED Results / Procedures / Treatments   Labs (all labs ordered are listed, but only abnormal results are displayed) Labs Reviewed  BASIC METABOLIC PANEL - Abnormal; Notable for the following components:      Result Value   CO2 21 (*)    All other components within normal limits  CBC  CBG MONITORING, ED  CBG MONITORING, ED  EKG None  Radiology No results found.  Procedures Procedures    Medications Ordered in ED Medications - No data to display  ED Course/ Medical Decision Making/ A&P Clinical Course as of 08/29/22 6222  [facility] Aug 29, 2022  0841 CBC and Maximino Sarin are personally reviewed and interpreted within normal limits [DR]    Clinical Course User Index [DR] Pattricia Boss, MD                           Medical Decision Making 31 year old male presents today complaining of fatigue and runny nose.  He has recently had COVID within the past several weeks.  Repeat testing for COVID is not indicated given his recent positive test.  Here he is afebrile with normal vital signs and normal physical exam.  He reports blood sugar was 81 at the scene initially.  He reports that he is feeling somewhat better since that time. Differential diagnosis includes but is not limited to other viral upper respiratory infection, ongoing symptoms of COVID,  other infections or etiologies of fatigue.  Patient's labs are reviewed and are within normal limits.  His vital signs are within normal limits.  Feel patient is stable for discharge to home.  We have discussed that patient will stay off of work today and drink plenty of fluids.  He is advised regarding need for follow-up and return precautions and voices understanding.           Final Clinical Impression(s) / ED Diagnoses Final diagnoses:  Acute upper respiratory infection    Rx / DC Orders ED Discharge Orders     None         Pattricia Boss, MD 08/29/22 [id number]

## 2022-08-30 ENCOUNTER — Encounter (HOSPITAL_COMMUNITY): Payer: Self-pay

## 2022-08-30 ENCOUNTER — Other Ambulatory Visit: Payer: Self-pay

## 2022-08-30 ENCOUNTER — Emergency Department (HOSPITAL_COMMUNITY)
Admission: EM | Admit: 2022-08-30 | Discharge: 2022-08-30 | Discharge status: Against Medical Advice | Payer: Medicaid Other | Attending: Emergency Medicine | Admitting: Emergency Medicine

## 2022-08-30 DIAGNOSIS — R509 Fever, unspecified: Secondary | ICD-10-CM | POA: Diagnosis present

## 2022-08-30 DIAGNOSIS — Z5321 Procedure and treatment not carried out due to patient leaving prior to being seen by health care provider: Secondary | ICD-10-CM | POA: Insufficient documentation

## 2022-08-30 DIAGNOSIS — U071 COVID-19: Secondary | ICD-10-CM | POA: Insufficient documentation

## 2022-08-30 NOTE — ED Triage Notes (Signed)
Pt BIB EMS. Pt tested positive for covid 2 weeks ago and is still having symptoms, chills, sore throat, fever, and body aches.

## 2023-02-13 ENCOUNTER — Other Ambulatory Visit: Payer: Self-pay

## 2023-02-13 ENCOUNTER — Emergency Department (HOSPITAL_COMMUNITY)
Admission: EM | Admit: 2023-02-13 | Discharge: 2023-02-14 | Disposition: A | Discharge status: Home / Self Care | Payer: Medicaid Other | Attending: Emergency Medicine | Admitting: Emergency Medicine

## 2023-02-13 DIAGNOSIS — J029 Acute pharyngitis, unspecified: Secondary | ICD-10-CM | POA: Diagnosis not present

## 2023-02-13 NOTE — ED Triage Notes (Signed)
Patient reports sore throat / dysphagia this evening with occasional dry cough .

## 2023-02-14 LAB — GROUP A STREP BY PCR: Group A Strep by PCR: NOT DETECTED

## 2023-02-14 MED ORDER — DEXAMETHASONE 4 MG PO TABS
10.0000 mg | ORAL_TABLET | Freq: Once | ORAL | Status: AC
  Administered 2023-02-14: 10 mg via ORAL
  Filled 2023-02-14: qty 3

## 2023-02-14 MED ORDER — NAPROXEN 500 MG PO TABS
500.0000 mg | ORAL_TABLET | Freq: Two times a day (BID) | ORAL | 0 refills | Status: AC
Start: 2023-02-14 — End: ?

## 2023-02-14 NOTE — ED Provider Notes (Signed)
[facility] Provider Note   CSN: [id number] Arrival date & time: 02/13/23  2346     History  Chief Complaint  Patient presents with   Sore Throat    Patrick James is a 32 y.o. male.  32 year old male presents to the emergency department for evaluation of sore throat which began yesterday.  Pain has been constant.  He reports a soreness which is aggravated by swallowing.  He has not taken any medications for his symptoms.  Does note an occasional dry cough.  Denies fevers, sick contacts, inability to swallow or drooling, vomiting.  The history is provided by the patient. No language interpreter was used.  Sore Throat       Home Medications Prior to Admission medications   Medication Sig Start Date End Date Taking? Authorizing Provider  naproxen (NAPROSYN) 500 MG tablet Take 1 tablet (500 mg total) by mouth 2 (two) times daily with a meal. For mild/moderate pain 02/14/23  Yes Antonietta Breach, PA-C  busPIRone (BUSPAR) 5 MG tablet Take 1 tablet (5 mg total) by mouth 2 (two) times daily. Patient not taking: Reported on 08/28/2022 03/03/21   Ladell Pier, MD  fluticasone [facility]) 50 MCG/ACT nasal spray Place 1 spray into both nostrils daily. Patient not taking: Reported on 08/28/2022 08/11/22   Sherrill Raring, PA-C      Allergies    Patient has no known allergies.    Review of Systems   Review of Systems Ten systems reviewed and are negative for acute change, except as noted in the HPI.    Physical Exam Updated Vital Signs BP 133/71 (BP Location: Right Arm)   Pulse 76   Temp 98 F (36.7 C) (Oral)   Resp 18   SpO2 97%   Physical Exam Vitals and nursing note reviewed.  Constitutional:      General: He is not in acute distress.    Appearance: He is well-developed. He is not diaphoretic.  HENT:     Head: Normocephalic and atraumatic.     Mouth/Throat:     Comments: Mild posterior oropharyngeal erythema with slight edema.   No exudates.  Uvula midline and patient tolerating secretions without difficulty.  Normal phonation. Eyes:     General: No scleral icterus.    Conjunctiva/sclera: Conjunctivae normal.  Neck:     Comments: No meningismus Pulmonary:     Effort: Pulmonary effort is normal. No respiratory distress.     Comments: Respirations even and unlabored Musculoskeletal:        General: Normal range of motion.     Cervical back: Normal range of motion.  Skin:    General: Skin is warm and dry.     Coloration: Skin is not pale.     Findings: No erythema or rash.  Neurological:     Mental Status: He is alert and oriented to person, place, and time.  Psychiatric:        Behavior: Behavior normal.     ED Results / Procedures / Treatments   Labs (all labs ordered are listed, but only abnormal results are displayed) Labs Reviewed  GROUP A STREP BY PCR    EKG None  Radiology No results found.  Procedures Procedures    Medications Ordered in ED Medications  dexamethasone (DECADRON) tablet 10 mg (has no administration in time range)    ED Course/ Medical Decision Making/ A&P  Medical Decision Making Risk Prescription drug management.   This patient presents to the ED for concern of sore throat, this involves an extensive number of treatment options, and is a complaint that carries with it a high risk of complications and morbidity.  The differential diagnosis includes viral pharyngitis vs strep throat vs mononucleosis vs peritonsillar abscess vs epiglottitis   Co morbidities that complicate the patient evaluation  ADHD Bipolar disorder   Lab Tests:  I Ordered, and personally interpreted labs.  The pertinent results include:  Negative strep screen   Cardiac Monitoring:  The patient was maintained on a cardiac monitor.  I personally viewed and interpreted the cardiac monitored which showed an underlying rhythm of: NSR   Medicines ordered and  prescription drug management:  I ordered medication including Decadron for throat pain  Reevaluation of the patient after these medicines showed that the patient stayed the same I have reviewed the patients home medicines and have made adjustments as needed   Test Considered:  CT soft tissue neck   Problem List / ED Course:  Pt afebrile without tonsillar exudate, negative strep. Presents with sore throat, dysphagia; diagnosis of viral pharyngitis. No abx indicated. Presentation not concerning for PTA or infxn spread to soft tissue. No trismus or uvula deviation.    Reevaluation:  After the interventions noted above, I reevaluated the patient and found that they have :stayed the same   Dispostion:  After consideration of the diagnostic results and the patients response to treatment, I feel that the patent would benefit from outpatient PCP follow up. Advised NSAIDs for pain, salt water gargles. Return precautions discussed and provided. Patient discharged in stable condition with no unaddressed concerns.         Final Clinical Impression(s) / ED Diagnoses Final diagnoses:  Viral pharyngitis    Rx / DC Orders ED Discharge Orders          Ordered    naproxen (NAPROSYN) 500 MG tablet  2 times daily with meals        02/14/23 0437              Antonietta Breach, PA-C 02/14/23 0449    Orpah Greek, MD 02/14/23 [phone]

## 2023-02-14 NOTE — ED Notes (Signed)
Patient tolerated fluids and po medications.  He verbalized understanding of discharge instructions and reasons to return to the ED

## 2023-04-27 ENCOUNTER — Ambulatory Visit: Payer: Medicaid Other | Admitting: Internal Medicine

## 2023-06-05 ENCOUNTER — Ambulatory Visit: Payer: Self-pay | Admitting: Nurse Practitioner

## 2023-11-11 ENCOUNTER — Other Ambulatory Visit: Payer: Self-pay

## 2023-11-11 ENCOUNTER — Emergency Department (HOSPITAL_BASED_OUTPATIENT_CLINIC_OR_DEPARTMENT_OTHER)
Admission: EM | Admit: 2023-11-11 | Discharge: 2023-11-11 | Disposition: A | Discharge status: Home / Self Care | Payer: MEDICAID | Attending: Emergency Medicine | Admitting: Emergency Medicine

## 2023-11-11 ENCOUNTER — Emergency Department (HOSPITAL_BASED_OUTPATIENT_CLINIC_OR_DEPARTMENT_OTHER): Payer: MEDICAID | Admitting: Radiology

## 2023-11-11 DIAGNOSIS — W010XXA Fall on same level from slipping, tripping and stumbling without subsequent striking against object, initial encounter: Secondary | ICD-10-CM | POA: Diagnosis not present

## 2023-11-11 DIAGNOSIS — M79605 Pain in left leg: Secondary | ICD-10-CM | POA: Diagnosis present

## 2023-11-11 MED ORDER — LIDOCAINE 5 % EX PTCH: 1.0000 | MEDICATED_PATCH | CUTANEOUS | 0 refills | Status: AC

## 2023-11-11 MED ORDER — METAXALONE 800 MG PO TABS: 800.0000 mg | ORAL_TABLET | Freq: Three times a day (TID) | ORAL | 0 refills | Status: AC

## 2023-11-11 NOTE — ED Triage Notes (Signed)
Pt via ems from home, slipped on leaves when taking the trash out. He did not hit the ground, but he has pain in his left anterior thigh, some posterior pain as well. Pt states bearing weight gives him sharp pain. Pt alert & oriented, nad noted.

## 2023-11-11 NOTE — ED Triage Notes (Signed)
Per ems  Pt fell at home while taking trash out. States he felt a pop in his knee; c/o pain in back of knee and front of thigh. No head injury or LOC. 160/70, pulse 75, RR20, SPo2 98, CBG 108.

## 2023-11-11 NOTE — ED Notes (Signed)
Patient given discharge instructions. Questions were answered. Patient verbalized understanding of discharge instructions and care at home.  Cab voucher/transport set-up for patient.

## 2023-11-11 NOTE — Discharge Instructions (Signed)
Today you are seen for left leg pain.  You most likely pulled a muscle.  Please pick up your prescription and take as needed.  You may also alternate taking Tylenol and Motrin as needed for pain.  Please do not take Motrin for greater than 5 days in a row as this may cause rebound headaches.  Thank you for letting us treat you today. After performing physical exam and reviewing your imaging, I feel you are safe to go home. Please follow up with your PCP in the next several days and provide them with your records from this visit. Return to the Emergency Room if pain becomes severe or symptoms worsen.

## 2023-11-11 NOTE — ED Provider Notes (Signed)
[place] EMERGENCY DEPARTMENT AT [facility] Provider Note   CSN: [id number] Arrival date & time: 11/11/23  1546     History  Chief Complaint  Patient presents with   Leg Pain    Patrick James is a 32 y.o. male presents today after slipping on some leaves while taking the trash out.  Patient denies hitting the ground but has left posterior thigh pain.  Patient states ambulating causes pain to the posterior thigh.  Patient denies numbness, tingling, back pain, or weakness.   Leg Pain      Home Medications Prior to Admission medications   Medication Sig Start Date End Date Taking? Authorizing Provider  lidocaine (LIDODERM) 5 % Place 1 patch onto the skin daily. Remove & Discard patch within 12 hours or as directed by MD 11/11/23  Yes Dolphus Jenny, PA-C  metaxalone (SKELAXIN) 800 MG tablet Take 1 tablet (800 mg total) by mouth 3 (three) times daily. 11/11/23  Yes Dolphus Jenny, PA-C  busPIRone (BUSPAR) 5 MG tablet Take 1 tablet (5 mg total) by mouth 2 (two) times daily. Patient not taking: Reported on 08/28/2022 03/03/21   Marcine Matar, MD  fluticasone [facility]) 50 MCG/ACT nasal spray Place 1 spray into both nostrils daily. Patient not taking: Reported on 08/28/2022 08/11/22   Theron Arista, PA-C  naproxen (NAPROSYN) 500 MG tablet Take 1 tablet (500 mg total) by mouth 2 (two) times daily with a meal. For mild/moderate pain 02/14/23   Antony Madura, PA-C      Allergies    Patient has no known allergies.    Review of Systems   Review of Systems  Musculoskeletal:  Positive for arthralgias and myalgias.    Physical Exam Updated Vital Signs BP 136/84 (BP Location: Right Arm)   Pulse 90   Temp 97.9 F (36.6 C) (Oral)   Resp 20   Ht 6\' 2"  (1.88 m)   Wt 136.1 kg   SpO2 100%   BMI 38.52 kg/m  Physical Exam Vitals and nursing note reviewed.  Constitutional:      General: He is not in acute distress.    Appearance: He is well-developed.  HENT:     Head:  Normocephalic and atraumatic.  Eyes:     Conjunctiva/sclera: Conjunctivae normal.  Cardiovascular:     Rate and Rhythm: Normal rate and regular rhythm.     Pulses: Normal pulses.     Heart sounds: No murmur heard. Pulmonary:     Effort: Pulmonary effort is normal. No respiratory distress.     Breath sounds: Normal breath sounds.  Abdominal:     Palpations: Abdomen is soft.     Tenderness: There is no abdominal tenderness.  Musculoskeletal:        General: No swelling.     Cervical back: Normal range of motion and neck supple.     Right lower leg: No edema.     Left lower leg: No edema.     Comments: Patient has tenderness to deep palpation to posterior thigh.  Patient is neurovascularly intact.  Patient has full range of motion.  Skin:    General: Skin is warm and dry.     Capillary Refill: Capillary refill takes less than 2 seconds.  Neurological:     General: No focal deficit present.     Mental Status: He is alert.  Psychiatric:        Mood and Affect: Mood normal.     ED Results / Procedures /  Treatments   Labs (all labs ordered are listed, but only abnormal results are displayed) Labs Reviewed - No data to display  EKG None  Radiology DG Femur Min 2 Views Left  Result Date: 11/11/2023 CLINICAL DATA:  Fall with femur pain. EXAM: LEFT FEMUR 2 VIEWS COMPARISON:  Left tibia and fibula dated 07/25/2020. FINDINGS: There is no evidence of fracture or other focal bone lesions. Soft tissues are unremarkable. IMPRESSION: Negative. Electronically Signed   By: Romona Curls M.D.   On: 11/11/2023 16:47    Procedures Procedures    Medications Ordered in ED Medications - No data to display  ED Course/ Medical Decision Making/ A&P                                 Medical Decision Making Amount and/or Complexity of Data Reviewed Radiology: ordered.  Risk Prescription drug management.   This patient presents to the ED with chief complaint(s) of left leg pain with  pertinent past medical history of none which further complicates the presenting complaint. The complaint involves an extensive differential diagnosis and also carries with it a high risk of complications and morbidity.    The differential diagnosis includes muscle strain, femur fracture  ED Course and Reassessment:   Independent visualization of imaging: - I independently visualized the following imaging with scope of interpretation limited to determining acute life threatening conditions related to emergency care: Femur x-ray, which revealed negative  Consultation: - Consulted or discussed management/test interpretation w/ external professional: None  Consideration for admission or further workup: Considered for admission or further workup however patient's vital signs, imaging, and physical exam have all been reassuring.  Patient most likely has a muscle strain.  Patient will be given short course of muscle relaxers and Lidoderm patches as needed for pain.  Patient should follow-up with Ortho outpatient if pain persist for further evaluation and treatment.        Final Clinical Impression(s) / ED Diagnoses Final diagnoses:  Left leg pain    Rx / DC Orders ED Discharge Orders          Ordered    metaxalone (SKELAXIN) 800 MG tablet  3 times daily        11/11/23 1659    lidocaine (LIDODERM) 5 %  Every 24 hours        11/11/23 1659              Dolphus Jenny, PA-C 11/11/23 1703    Virgina Norfolk, DO 11/11/23 1902

## 2024-10-08 ENCOUNTER — Ambulatory Visit: Payer: MEDICAID

## 2024-10-08 DIAGNOSIS — Z23 Encounter for immunization: Secondary | ICD-10-CM

## 2024-11-05 NOTE — Congregational Nurse Program (Signed)
  Dept: [phone]   Congregational Nurse Program Note  Date of Encounter: 11/05/2024  Telephone call to resident per request of [facility] staff.  Resident stated he didn't want to come out due to the cold and rain.  Has respiratory symptoms but didn't not sound congested nor was he coughing during call.  Recommended that he go to [facility] if symptoms do not improve since he does not have a primary care doctor. Past Medical History: Past Medical History:  Diagnosis Date   ADHD (attention deficit hyperactivity disorder)    ADHD (attention deficit hyperactivity disorder)    Asthma    Bipolar 1 disorder (HCC)    Depression    Eczema    Scabies     Encounter Details:  Community Questionnaire - 11/05/24 1600       Questionnaire   Ask client: Do you give verbal consent for me to treat you today? Yes    Student Assistance N/A    Location Patient [place]    Encounter Setting Phone/Text/Email    Population Status Unknown   Has own apartment at [facility]    Insurance/Financial Assistance Referral N/A    Medication N/A    Medical Provider No    Screening Referrals Made Annual Wellness Visit    Medical Referrals Made Urgent Care    Medical Appointment Completed N/A    CNP Interventions Counsel;Advocate/Support;Navigate Healthcare System    Screenings CN Performed N/A    ED Visit Averted N/A    Life-Saving Intervention Made N/A

## 2024-11-20 NOTE — Congregational Nurse Program (Signed)
°  Dept: [phone]   Congregational Nurse Program Note  Date of Encounter: 11/19/2024  Clinic visit to check blood pressure and weight, BP 120/76.  Weight is 305 Lbs, discussed necessity to manage weight and prevent further weight gain.  Past Medical History: Past Medical History:  Diagnosis Date   ADHD (attention deficit hyperactivity disorder)    ADHD (attention deficit hyperactivity disorder)    Asthma    Bipolar 1 disorder (HCC)    Depression    Eczema    Scabies     Encounter Details:  Community Questionnaire - 11/19/24 1705       Questionnaire   Ask client: Do you give verbal consent for me to treat you today? Yes    Student Assistance N/A    Location Patient [place]    Encounter Setting CN site    Population Status Unknown   Has own apartment at [facility]    Insurance/Financial Assistance Referral N/A    Medication N/A    Medical Provider No    Screening Referrals Made Annual Wellness Visit    Medical Referrals Made N/A    Medical Appointment Completed N/A    CNP Interventions Counsel;Advocate/Support;Case Management    Screenings CN Performed Blood Pressure    ED Visit Averted N/A    Life-Saving Intervention Made N/A

## 2024-12-03 NOTE — Congregational Nurse Program (Signed)
" °  Dept: [phone]   Congregational Nurse Program Note  Date of Encounter: 12/03/2024  Resident clinic visit to discuss prevention of STIs, has a new potential partner.  Discussed the HPV vaccine and the benefits of consistent use of condoms to limit disease transmission. Past Medical History: Past Medical History:  Diagnosis Date   ADHD (attention deficit hyperactivity disorder)    ADHD (attention deficit hyperactivity disorder)    Asthma    Bipolar 1 disorder (HCC)    Depression    Eczema    Scabies     Encounter Details:  Community Questionnaire - 12/03/24 1400       Questionnaire   Ask client: Do you give verbal consent for me to treat you today? Yes    Student Assistance N/A    Location Patient [place]    Encounter Setting CN site    Population Status Unknown   Has own apartment at [facility]    Insurance/Financial Assistance Referral N/A    Medication N/A    Medical Provider No    Screening Referrals Made Annual Wellness Visit    Medical Referrals Made N/A    Medical Appointment Completed N/A    CNP Interventions Counsel;Advocate/Support;Case Management;Educate    Screenings CN Performed N/A    ED Visit Averted N/A    Life-Saving Intervention Made N/A            "
